# Patient Record
Sex: Female | Born: 1940 | Race: White | Hispanic: No | Marital: Single | Smoking: Never smoker
Health system: Southern US, Community
[De-identification: ages and names within clinical notes are randomized; demographics above are authoritative.]

## PROBLEM LIST (undated history)

## (undated) DIAGNOSIS — I10 Essential (primary) hypertension: Secondary | ICD-10-CM

## (undated) DIAGNOSIS — I482 Chronic atrial fibrillation, unspecified: Secondary | ICD-10-CM

## (undated) DIAGNOSIS — I447 Left bundle-branch block, unspecified: Secondary | ICD-10-CM

## (undated) DIAGNOSIS — K219 Gastro-esophageal reflux disease without esophagitis: Secondary | ICD-10-CM

## (undated) DIAGNOSIS — F329 Major depressive disorder, single episode, unspecified: Secondary | ICD-10-CM

## (undated) DIAGNOSIS — G4733 Obstructive sleep apnea (adult) (pediatric): Secondary | ICD-10-CM

## (undated) DIAGNOSIS — Z950 Presence of cardiac pacemaker: Secondary | ICD-10-CM

## (undated) DIAGNOSIS — F32A Depression, unspecified: Secondary | ICD-10-CM

## (undated) DIAGNOSIS — N183 Chronic kidney disease, stage 3 unspecified: Secondary | ICD-10-CM

## (undated) DIAGNOSIS — I5032 Chronic diastolic (congestive) heart failure: Secondary | ICD-10-CM

## (undated) DIAGNOSIS — E785 Hyperlipidemia, unspecified: Secondary | ICD-10-CM

## (undated) DIAGNOSIS — M109 Gout, unspecified: Secondary | ICD-10-CM

## (undated) DIAGNOSIS — E1129 Type 2 diabetes mellitus with other diabetic kidney complication: Secondary | ICD-10-CM

## (undated) DIAGNOSIS — J449 Chronic obstructive pulmonary disease, unspecified: Secondary | ICD-10-CM

## (undated) HISTORY — PX: PACEMAKER PLACEMENT: SHX43

---
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
## 2019-03-03 ENCOUNTER — Emergency Department (HOSPITAL_COMMUNITY): Payer: Medicare Other

## 2019-03-03 ENCOUNTER — Emergency Department (HOSPITAL_COMMUNITY)
Admission: EM | Admit: 2019-03-03 | Discharge: 2019-03-03 | Disposition: A | Discharge status: Home / Self Care | Payer: Medicare Other | Source: Home / Self Care | Attending: Emergency Medicine | Admitting: Emergency Medicine

## 2019-03-03 ENCOUNTER — Encounter (HOSPITAL_COMMUNITY): Payer: Self-pay

## 2019-03-03 DIAGNOSIS — N390 Urinary tract infection, site not specified: Secondary | ICD-10-CM | POA: Diagnosis not present

## 2019-03-03 DIAGNOSIS — W19XXXA Unspecified fall, initial encounter: Secondary | ICD-10-CM | POA: Insufficient documentation

## 2019-03-03 DIAGNOSIS — Z950 Presence of cardiac pacemaker: Secondary | ICD-10-CM | POA: Insufficient documentation

## 2019-03-03 DIAGNOSIS — E119 Type 2 diabetes mellitus without complications: Secondary | ICD-10-CM | POA: Insufficient documentation

## 2019-03-03 DIAGNOSIS — Y999 Unspecified external cause status: Secondary | ICD-10-CM | POA: Insufficient documentation

## 2019-03-03 DIAGNOSIS — M109 Gout, unspecified: Secondary | ICD-10-CM

## 2019-03-03 DIAGNOSIS — Y929 Unspecified place or not applicable: Secondary | ICD-10-CM | POA: Insufficient documentation

## 2019-03-03 DIAGNOSIS — R296 Repeated falls: Secondary | ICD-10-CM | POA: Diagnosis not present

## 2019-03-03 DIAGNOSIS — Y939 Activity, unspecified: Secondary | ICD-10-CM | POA: Insufficient documentation

## 2019-03-03 HISTORY — DX: Gout, unspecified: M10.9

## 2019-03-03 HISTORY — DX: Presence of cardiac pacemaker: Z95.0

## 2019-03-03 MED ORDER — COLCHICINE 0.6 MG PO TABS
0.6000 mg | ORAL_TABLET | Freq: Once | ORAL | Status: AC
  Administered 2019-03-03: 0.6 mg via ORAL
  Filled 2019-03-03: qty 1

## 2019-03-03 MED ORDER — COLCHICINE 0.6 MG PO TABS: 0.6000 mg | ORAL_TABLET | Freq: Every day | ORAL | 0 refills | Status: DC

## 2019-03-03 NOTE — ED Notes (Signed)
Pt discharged with all belongings. Discharge instructions reviewed with pt, and pt verbalized understanding. Opportunity for questions provided.  

## 2019-03-03 NOTE — ED Triage Notes (Signed)
Pt states that she fell two days ago, today swelling to R ankle, pt also has gout, did not head not LOC. Pt Edythe Lynn [phone] Son

## 2019-03-03 NOTE — Discharge Instructions (Addendum)
Please read and follow all provided instructions.  Your diagnoses today include:  1. Acute gout of right foot, unspecified cause    Tests performed today include:  An x-ray of the affected area - does NOT show any broken bones  Vital signs. See below for your results today.   Medications prescribed:   Colchicine - medication for gout flare  Take any prescribed medications only as directed.  Home care instructions:   Follow any educational materials contained in this packet  Follow R.I.C.E. Protocol:  R - rest your injury   I  - use ice on injury without applying directly to skin  C - compress injury with bandage or splint  E - elevate the injury as much as possible  Follow-up instructions: Please follow-up with your primary care provider. Please see attached referrals for assistance in finding a new provider here in [place].   Return instructions:   Please return if your toes or feet are numb or tingling, appear gray or blue, or you have severe pain (also elevate the leg and loosen splint or wrap if you were given one)  Please return to the Emergency Department if you experience worsening symptoms.   Please return if you have any other emergent concerns.  Additional Information:  Your vital signs today were: BP (!) 139/57    Pulse 64    Temp 99 F (37.2 C)    Resp 18    SpO2 93%  If your blood pressure (BP) was elevated above 135/85 this visit, please have this repeated by your doctor within one month. --------------

## 2019-03-03 NOTE — ED Provider Notes (Signed)
[facility] Provider Note   CSN: [id number] Arrival date & time: 03/03/19  1912     History   Chief Complaint Chief Complaint  Patient presents with  . Ankle Pain    HPI Lauren Holland is a 78 y.o. female.     Patient with history of diabetes, gout, implanted pacemaker presents to the emergency department with complaint of right foot pain.  Patient states that she fell 2 days ago and twisted her ankle.  No head or neck injury at that time.  Afterwards she developed redness and pain in her foot which is more consistent for previous flares of gout.  She is on allopurinol for this.  She recently moved from [place] 3 weeks ago and is now living in the [place] area.  She denies any knee or hip pain.  She has been ambulatory.  No fevers.  No treatments prior to arrival.     Past Medical History:  Diagnosis Date  . Diabetes mellitus without complication (HCC)   . Gout   . Pacemaker     There are no active problems to display for this patient.   History reviewed. No pertinent surgical history.   OB History   No obstetric history on file.      Home Medications    Prior to Admission medications   Medication Sig Start Date End Date Taking? Authorizing Provider  colchicine 0.6 MG tablet Take 1 tablet (0.6 mg total) by mouth daily. 03/03/19   Renne CriglerGeiple, Jevaeh Shams, PA-C    Family History No family history on file.  Social History Social History   Tobacco Use  . Smoking status: Not on file  Substance Use Topics  . Alcohol use: Not on file  . Drug use: Not on file     Allergies   Patient has no known allergies.   Review of Systems Review of Systems  Constitutional: Negative for activity change.  Musculoskeletal: Positive for arthralgias and joint swelling. Negative for back pain and neck pain.  Skin: Negative for wound.  Neurological: Negative for weakness and numbness.     Physical Exam Updated Vital Signs BP (!) 139/57    Pulse 64   Temp 99 F (37.2 C)   Resp 18   SpO2 93%   Physical Exam Vitals signs and nursing note reviewed.  Constitutional:      Appearance: She is well-developed.  HENT:     Head: Normocephalic and atraumatic.  Eyes:     Pupils: Pupils are equal, round, and reactive to light.  Neck:     Musculoskeletal: Normal range of motion and neck supple.  Cardiovascular:     Pulses: Normal pulses. No decreased pulses.  Musculoskeletal:        General: Tenderness present.     Right knee: Normal.     Right ankle: She exhibits normal range of motion, no swelling and no ecchymosis. Tenderness.     Right lower leg: Normal.     Right foot: Normal range of motion. Tenderness present. No bony tenderness.  Skin:    General: Skin is warm and dry.  Neurological:     Mental Status: She is alert.     Sensory: No sensory deficit.     Comments: Motor, sensation, and vascular distal to the injury is fully intact.       ED Treatments / Results  Labs (all labs ordered are listed, but only abnormal results are displayed) Labs Reviewed - No data to display  EKG None  Radiology Dg Ankle Complete Right  Result Date: 03/03/2019 CLINICAL DATA:  Fall EXAM: RIGHT ANKLE - COMPLETE 3+ VIEW COMPARISON:  None. FINDINGS: There is no evidence of fracture, dislocation, or joint effusion. 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 There is no evidence of arthropathy or other focal bone abnormality. Moderate lateral malleolus soft tissue swelling. IMPRESSION: Moderate lateral soft tissue swelling without acute fracture or dislocation. Ossific fragment at the medial malleolus is age indeterminate, but likely chronic. Correlation for point tenderness recommended, as a small avulsion fracture may also have this appearance. Electronically Signed   By: Ulyses Jarred M.D.   On: 03/03/2019 20:31    Procedures Procedures (including critical care time)  Medications Ordered in ED Medications   colchicine tablet 0.6 mg (0.6 mg Oral Given 03/03/19 2107)     Initial Impression / Assessment and Plan / ED Course  I have reviewed the triage vital signs and the nursing notes.  Pertinent labs & imaging results that were available during my care of the patient were reviewed by me and considered in my medical decision making (see chart for details).        Patient seen and examined.  I reviewed some of her recent records from [facility] physicians. She is on a blood thinner. She has been on colchicine in past. Reviewed patient provided list of current meds.   Vital signs reviewed and are as follows: BP (!) 139/57   Pulse 64   Temp 99 F (37.2 C)   Resp 18   SpO2 93%   8:58 PM X-ray images personally reviewed and interpreted.  No point tenderness at area of potential avulsion fracture this is likely chronic.  Symptoms today are most consistent with gouty flare.  Will start patient on colchicine.  Per records in care everywhere, she has taken colchicine in the past.  I would avoid steroids at this time given her history of diabetes.  Patient given dose of colchicine prior to discharge and prescription for home.  Encouraged PCP follow-up for treatment of her chronic medical problems.  She is given information to help obtain follow-up.  Encouraged return with worsening pain, swelling, fever.  Patient verbalizes understanding agrees with plan.   Final Clinical Impressions(s) / ED Diagnoses   Final diagnoses:  Acute gout of right foot, unspecified cause   Patient with right foot pain and.  Recent injury but x-rays are negative today.  No signs of fracture.  Foot is neurovascularly intact.  Mild warmth and erythema of the foot consistent with gouty flare.  Patient states it feels like her previous gout.  She is a diabetic but I do not see any signs of infection tonight.  I doubt a septic arthritis.  Patient started on colchicine as she is not a good candidate for prednisone or NSAIDs.  50% dose  given due to age.   ED Discharge Orders         Ordered    colchicine 0.6 MG tablet  Daily     03/03/19 2100           Carlisle Cater, Hershal Coria 03/03/19 2349    Mesner, Corene Cornea, MD 03/04/19 [phone]

## 2019-03-04 ENCOUNTER — Other Ambulatory Visit: Payer: Self-pay

## 2019-03-04 ENCOUNTER — Inpatient Hospital Stay (HOSPITAL_COMMUNITY)
Admission: EM | Admit: 2019-03-04 | Discharge: 2019-03-08 | DRG: 690 | Disposition: A | Discharge status: Skilled Nursing Facility | Payer: Medicare Other | Attending: Internal Medicine | Admitting: Internal Medicine

## 2019-03-04 ENCOUNTER — Emergency Department (HOSPITAL_COMMUNITY): Payer: Medicare Other

## 2019-03-04 ENCOUNTER — Encounter (HOSPITAL_COMMUNITY): Payer: Self-pay

## 2019-03-04 DIAGNOSIS — S301XXA Contusion of abdominal wall, initial encounter: Secondary | ICD-10-CM

## 2019-03-04 DIAGNOSIS — I4821 Permanent atrial fibrillation: Secondary | ICD-10-CM | POA: Diagnosis present

## 2019-03-04 DIAGNOSIS — Z885 Allergy status to narcotic agent status: Secondary | ICD-10-CM

## 2019-03-04 DIAGNOSIS — M159 Polyosteoarthritis, unspecified: Secondary | ICD-10-CM | POA: Diagnosis present

## 2019-03-04 DIAGNOSIS — F32A Depression, unspecified: Secondary | ICD-10-CM | POA: Diagnosis present

## 2019-03-04 DIAGNOSIS — B961 Klebsiella pneumoniae [K. pneumoniae] as the cause of diseases classified elsewhere: Secondary | ICD-10-CM | POA: Diagnosis present

## 2019-03-04 DIAGNOSIS — R531 Weakness: Secondary | ICD-10-CM

## 2019-03-04 DIAGNOSIS — Z888 Allergy status to other drugs, medicaments and biological substances status: Secondary | ICD-10-CM

## 2019-03-04 DIAGNOSIS — E860 Dehydration: Secondary | ICD-10-CM | POA: Diagnosis present

## 2019-03-04 DIAGNOSIS — I482 Chronic atrial fibrillation, unspecified: Secondary | ICD-10-CM | POA: Diagnosis present

## 2019-03-04 DIAGNOSIS — X501XXA Overexertion from prolonged static or awkward postures, initial encounter: Secondary | ICD-10-CM

## 2019-03-04 DIAGNOSIS — Z1159 Encounter for screening for other viral diseases: Secondary | ICD-10-CM

## 2019-03-04 DIAGNOSIS — E1129 Type 2 diabetes mellitus with other diabetic kidney complication: Secondary | ICD-10-CM | POA: Diagnosis present

## 2019-03-04 DIAGNOSIS — N183 Chronic kidney disease, stage 3 unspecified: Secondary | ICD-10-CM | POA: Diagnosis present

## 2019-03-04 DIAGNOSIS — Z882 Allergy status to sulfonamides status: Secondary | ICD-10-CM

## 2019-03-04 DIAGNOSIS — F329 Major depressive disorder, single episode, unspecified: Secondary | ICD-10-CM | POA: Diagnosis present

## 2019-03-04 DIAGNOSIS — Z950 Presence of cardiac pacemaker: Secondary | ICD-10-CM

## 2019-03-04 DIAGNOSIS — K219 Gastro-esophageal reflux disease without esophagitis: Secondary | ICD-10-CM | POA: Diagnosis present

## 2019-03-04 DIAGNOSIS — E1122 Type 2 diabetes mellitus with diabetic chronic kidney disease: Secondary | ICD-10-CM | POA: Diagnosis present

## 2019-03-04 DIAGNOSIS — R296 Repeated falls: Secondary | ICD-10-CM | POA: Diagnosis present

## 2019-03-04 DIAGNOSIS — G4733 Obstructive sleep apnea (adult) (pediatric): Secondary | ICD-10-CM

## 2019-03-04 DIAGNOSIS — I10 Essential (primary) hypertension: Secondary | ICD-10-CM | POA: Diagnosis present

## 2019-03-04 DIAGNOSIS — I130 Hypertensive heart and chronic kidney disease with heart failure and stage 1 through stage 4 chronic kidney disease, or unspecified chronic kidney disease: Secondary | ICD-10-CM | POA: Diagnosis present

## 2019-03-04 DIAGNOSIS — I447 Left bundle-branch block, unspecified: Secondary | ICD-10-CM | POA: Diagnosis present

## 2019-03-04 DIAGNOSIS — W19XXXA Unspecified fall, initial encounter: Secondary | ICD-10-CM | POA: Diagnosis present

## 2019-03-04 DIAGNOSIS — S99911A Unspecified injury of right ankle, initial encounter: Secondary | ICD-10-CM | POA: Diagnosis present

## 2019-03-04 DIAGNOSIS — Z7901 Long term (current) use of anticoagulants: Secondary | ICD-10-CM

## 2019-03-04 DIAGNOSIS — M109 Gout, unspecified: Secondary | ICD-10-CM | POA: Diagnosis present

## 2019-03-04 DIAGNOSIS — W19XXXD Unspecified fall, subsequent encounter: Secondary | ICD-10-CM

## 2019-03-04 DIAGNOSIS — E785 Hyperlipidemia, unspecified: Secondary | ICD-10-CM | POA: Diagnosis present

## 2019-03-04 DIAGNOSIS — J449 Chronic obstructive pulmonary disease, unspecified: Secondary | ICD-10-CM | POA: Diagnosis present

## 2019-03-04 DIAGNOSIS — I350 Nonrheumatic aortic (valve) stenosis: Secondary | ICD-10-CM | POA: Diagnosis present

## 2019-03-04 DIAGNOSIS — E1151 Type 2 diabetes mellitus with diabetic peripheral angiopathy without gangrene: Secondary | ICD-10-CM | POA: Diagnosis present

## 2019-03-04 DIAGNOSIS — Z8249 Family history of ischemic heart disease and other diseases of the circulatory system: Secondary | ICD-10-CM

## 2019-03-04 DIAGNOSIS — I5032 Chronic diastolic (congestive) heart failure: Secondary | ICD-10-CM | POA: Diagnosis present

## 2019-03-04 DIAGNOSIS — Z96653 Presence of artificial knee joint, bilateral: Secondary | ICD-10-CM | POA: Diagnosis present

## 2019-03-04 DIAGNOSIS — N390 Urinary tract infection, site not specified: Secondary | ICD-10-CM | POA: Diagnosis present

## 2019-03-04 DIAGNOSIS — Z833 Family history of diabetes mellitus: Secondary | ICD-10-CM

## 2019-03-04 DIAGNOSIS — Z66 Do not resuscitate: Secondary | ICD-10-CM | POA: Diagnosis present

## 2019-03-04 HISTORY — DX: Chronic diastolic (congestive) heart failure: I50.32

## 2019-03-04 HISTORY — DX: Chronic atrial fibrillation, unspecified: I48.20

## 2019-03-04 HISTORY — DX: Type 2 diabetes mellitus with other diabetic kidney complication: E11.29

## 2019-03-04 HISTORY — DX: Chronic obstructive pulmonary disease, unspecified: J44.9

## 2019-03-04 HISTORY — DX: Essential (primary) hypertension: I10

## 2019-03-04 HISTORY — DX: Hyperlipidemia, unspecified: E78.5

## 2019-03-04 HISTORY — DX: Depression, unspecified: F32.A

## 2019-03-04 HISTORY — DX: Obstructive sleep apnea (adult) (pediatric): G47.33

## 2019-03-04 HISTORY — DX: Chronic kidney disease, stage 3 unspecified: N18.30

## 2019-03-04 HISTORY — DX: Left bundle-branch block, unspecified: I44.7

## 2019-03-04 HISTORY — DX: Gastro-esophageal reflux disease without esophagitis: K21.9

## 2019-03-04 LAB — CBC
HCT: 37.1 % (ref 36.0–46.0)
Hemoglobin: 11.9 g/dL — ABNORMAL LOW (ref 12.0–15.0)
MCH: 32.1 pg (ref 26.0–34.0)
MCHC: 32.1 g/dL (ref 30.0–36.0)
MCV: 100 fL (ref 80.0–100.0)
Platelets: 146 10*3/uL — ABNORMAL LOW (ref 150–400)
RBC: 3.71 MIL/uL — ABNORMAL LOW (ref 3.87–5.11)
RDW: 15.3 % (ref 11.5–15.5)
WBC: 10.6 10*3/uL — ABNORMAL HIGH (ref 4.0–10.5)
nRBC: 0 % (ref 0.0–0.2)

## 2019-03-04 LAB — BASIC METABOLIC PANEL
Anion gap: 12 (ref 5–15)
BUN: 40 mg/dL — ABNORMAL HIGH (ref 8–23)
CO2: 25 mmol/L (ref 22–32)
Calcium: 9.1 mg/dL (ref 8.9–10.3)
Chloride: 103 mmol/L (ref 98–111)
Creatinine, Ser: 1.3 mg/dL — ABNORMAL HIGH (ref 0.44–1.00)
GFR calc Af Amer: 46 mL/min — ABNORMAL LOW (ref >60)
GFR calc non Af Amer: 40 mL/min — ABNORMAL LOW (ref >60)
Glucose, Bld: 192 mg/dL — ABNORMAL HIGH (ref 70–99)
Potassium: 4 mmol/L (ref 3.5–5.1)
Sodium: 140 mmol/L (ref 135–145)

## 2019-03-04 NOTE — ED Triage Notes (Signed)
Pt reports fall x 2 yesterday. Reports generalized weakness, difficulty transferring. Denies N/V, endorses SOB but states no worse than normal. Reports hx of CHF.

## 2019-03-05 ENCOUNTER — Encounter (HOSPITAL_COMMUNITY): Payer: Self-pay | Admitting: Internal Medicine

## 2019-03-05 ENCOUNTER — Emergency Department (HOSPITAL_COMMUNITY): Payer: Medicare Other

## 2019-03-05 DIAGNOSIS — S99911A Unspecified injury of right ankle, initial encounter: Secondary | ICD-10-CM | POA: Diagnosis present

## 2019-03-05 DIAGNOSIS — G4733 Obstructive sleep apnea (adult) (pediatric): Secondary | ICD-10-CM | POA: Diagnosis present

## 2019-03-05 DIAGNOSIS — R29898 Other symptoms and signs involving the musculoskeletal system: Secondary | ICD-10-CM | POA: Diagnosis not present

## 2019-03-05 DIAGNOSIS — I447 Left bundle-branch block, unspecified: Secondary | ICD-10-CM | POA: Diagnosis present

## 2019-03-05 DIAGNOSIS — W19XXXD Unspecified fall, subsequent encounter: Secondary | ICD-10-CM | POA: Diagnosis not present

## 2019-03-05 DIAGNOSIS — Z950 Presence of cardiac pacemaker: Secondary | ICD-10-CM | POA: Diagnosis not present

## 2019-03-05 DIAGNOSIS — Z885 Allergy status to narcotic agent status: Secondary | ICD-10-CM | POA: Diagnosis not present

## 2019-03-05 DIAGNOSIS — Z8249 Family history of ischemic heart disease and other diseases of the circulatory system: Secondary | ICD-10-CM | POA: Diagnosis not present

## 2019-03-05 DIAGNOSIS — I482 Chronic atrial fibrillation, unspecified: Secondary | ICD-10-CM | POA: Diagnosis present

## 2019-03-05 DIAGNOSIS — Z888 Allergy status to other drugs, medicaments and biological substances status: Secondary | ICD-10-CM | POA: Diagnosis not present

## 2019-03-05 DIAGNOSIS — I130 Hypertensive heart and chronic kidney disease with heart failure and stage 1 through stage 4 chronic kidney disease, or unspecified chronic kidney disease: Secondary | ICD-10-CM | POA: Diagnosis present

## 2019-03-05 DIAGNOSIS — E785 Hyperlipidemia, unspecified: Secondary | ICD-10-CM | POA: Insufficient documentation

## 2019-03-05 DIAGNOSIS — S301XXA Contusion of abdominal wall, initial encounter: Secondary | ICD-10-CM | POA: Diagnosis present

## 2019-03-05 DIAGNOSIS — N183 Chronic kidney disease, stage 3 unspecified: Secondary | ICD-10-CM | POA: Diagnosis present

## 2019-03-05 DIAGNOSIS — E1122 Type 2 diabetes mellitus with diabetic chronic kidney disease: Secondary | ICD-10-CM | POA: Diagnosis present

## 2019-03-05 DIAGNOSIS — J449 Chronic obstructive pulmonary disease, unspecified: Secondary | ICD-10-CM | POA: Diagnosis present

## 2019-03-05 DIAGNOSIS — Z833 Family history of diabetes mellitus: Secondary | ICD-10-CM | POA: Diagnosis not present

## 2019-03-05 DIAGNOSIS — I5032 Chronic diastolic (congestive) heart failure: Secondary | ICD-10-CM | POA: Diagnosis present

## 2019-03-05 DIAGNOSIS — R296 Repeated falls: Secondary | ICD-10-CM | POA: Diagnosis present

## 2019-03-05 DIAGNOSIS — Z9989 Dependence on other enabling machines and devices: Secondary | ICD-10-CM

## 2019-03-05 DIAGNOSIS — N390 Urinary tract infection, site not specified: Secondary | ICD-10-CM | POA: Diagnosis present

## 2019-03-05 DIAGNOSIS — R531 Weakness: Secondary | ICD-10-CM

## 2019-03-05 DIAGNOSIS — M109 Gout, unspecified: Secondary | ICD-10-CM | POA: Diagnosis present

## 2019-03-05 DIAGNOSIS — Z794 Long term (current) use of insulin: Secondary | ICD-10-CM

## 2019-03-05 DIAGNOSIS — F329 Major depressive disorder, single episode, unspecified: Secondary | ICD-10-CM | POA: Diagnosis present

## 2019-03-05 DIAGNOSIS — K219 Gastro-esophageal reflux disease without esophagitis: Secondary | ICD-10-CM | POA: Diagnosis present

## 2019-03-05 DIAGNOSIS — Z882 Allergy status to sulfonamides status: Secondary | ICD-10-CM | POA: Diagnosis not present

## 2019-03-05 DIAGNOSIS — W19XXXA Unspecified fall, initial encounter: Secondary | ICD-10-CM | POA: Diagnosis present

## 2019-03-05 DIAGNOSIS — I4821 Permanent atrial fibrillation: Secondary | ICD-10-CM | POA: Diagnosis present

## 2019-03-05 DIAGNOSIS — Z1159 Encounter for screening for other viral diseases: Secondary | ICD-10-CM | POA: Diagnosis not present

## 2019-03-05 DIAGNOSIS — E1151 Type 2 diabetes mellitus with diabetic peripheral angiopathy without gangrene: Secondary | ICD-10-CM | POA: Diagnosis present

## 2019-03-05 DIAGNOSIS — I10 Essential (primary) hypertension: Secondary | ICD-10-CM | POA: Diagnosis present

## 2019-03-05 DIAGNOSIS — E1129 Type 2 diabetes mellitus with other diabetic kidney complication: Secondary | ICD-10-CM | POA: Diagnosis present

## 2019-03-05 DIAGNOSIS — X501XXA Overexertion from prolonged static or awkward postures, initial encounter: Secondary | ICD-10-CM | POA: Diagnosis not present

## 2019-03-05 DIAGNOSIS — F32A Depression, unspecified: Secondary | ICD-10-CM | POA: Diagnosis present

## 2019-03-05 LAB — BASIC METABOLIC PANEL
Anion gap: 14 (ref 5–15)
BUN: 36 mg/dL — ABNORMAL HIGH (ref 8–23)
CO2: 24 mmol/L (ref 22–32)
Calcium: 9.3 mg/dL (ref 8.9–10.3)
Chloride: 104 mmol/L (ref 98–111)
Creatinine, Ser: 1.08 mg/dL — ABNORMAL HIGH (ref 0.44–1.00)
GFR calc Af Amer: 58 mL/min — ABNORMAL LOW (ref >60)
GFR calc non Af Amer: 50 mL/min — ABNORMAL LOW (ref >60)
Glucose, Bld: 116 mg/dL — ABNORMAL HIGH (ref 70–99)
Potassium: 3.7 mmol/L (ref 3.5–5.1)
Sodium: 142 mmol/L (ref 135–145)

## 2019-03-05 LAB — GLUCOSE, CAPILLARY
Glucose-Capillary: 150 mg/dL — ABNORMAL HIGH (ref 70–99)
Glucose-Capillary: 125 mg/dL — ABNORMAL HIGH (ref 70–99)
Glucose-Capillary: 182 mg/dL — ABNORMAL HIGH (ref 70–99)

## 2019-03-05 LAB — URINALYSIS, ROUTINE W REFLEX MICROSCOPIC
Bilirubin Urine: NEGATIVE
Glucose, UA: 50 mg/dL — AB
Ketones, ur: NEGATIVE mg/dL
Nitrite: POSITIVE — AB
Protein, ur: 100 mg/dL — AB
Specific Gravity, Urine: 1.03 (ref 1.005–1.030)
WBC, UA: >50 WBC/hpf — ABNORMAL HIGH (ref 0–5)
pH: 5 (ref 5.0–8.0)

## 2019-03-05 LAB — HEMOGLOBIN A1C
Hgb A1c MFr Bld: 7.4 % — ABNORMAL HIGH (ref 4.8–5.6)
Mean Plasma Glucose: 165.68 mg/dL

## 2019-03-05 LAB — CBG MONITORING, ED: Glucose-Capillary: 93 mg/dL (ref 70–99)

## 2019-03-05 LAB — CBC
HCT: 39.6 % (ref 36.0–46.0)
Hemoglobin: 12.7 g/dL (ref 12.0–15.0)
MCH: 32.2 pg (ref 26.0–34.0)
MCHC: 32.1 g/dL (ref 30.0–36.0)
MCV: 100.3 fL — ABNORMAL HIGH (ref 80.0–100.0)
Platelets: 139 10*3/uL — ABNORMAL LOW (ref 150–400)
RBC: 3.95 MIL/uL (ref 3.87–5.11)
RDW: 15.2 % (ref 11.5–15.5)
WBC: 8.3 10*3/uL (ref 4.0–10.5)
nRBC: 0 % (ref 0.0–0.2)

## 2019-03-05 LAB — SARS CORONAVIRUS 2 BY RT PCR (HOSPITAL ORDER, PERFORMED IN ~~LOC~~ HOSPITAL LAB): SARS Coronavirus 2: NEGATIVE

## 2019-03-05 LAB — PROTIME-INR
INR: 1.3 — ABNORMAL HIGH (ref 0.8–1.2)
INR: 1.4 — ABNORMAL HIGH (ref 0.8–1.2)
Prothrombin Time: 16 seconds — ABNORMAL HIGH (ref 11.4–15.2)
Prothrombin Time: 17.2 seconds — ABNORMAL HIGH (ref 11.4–15.2)

## 2019-03-05 LAB — BRAIN NATRIURETIC PEPTIDE: B Natriuretic Peptide: 38.6 pg/mL (ref 0.0–100.0)

## 2019-03-05 MED ORDER — MONTELUKAST SODIUM 10 MG PO TABS
10.0000 mg | ORAL_TABLET | Freq: Every day | ORAL | Status: DC
  Administered 2019-03-05 – 2019-03-07 (×3): 10 mg via ORAL
  Filled 2019-03-05 (×3): qty 1

## 2019-03-05 MED ORDER — IBUPROFEN 200 MG PO TABS: 600.0000 mg | ORAL_TABLET | Freq: Three times a day (TID) | ORAL | Status: DC

## 2019-03-05 MED ORDER — ASPIRIN 81 MG PO CHEW
81.0000 mg | CHEWABLE_TABLET | Freq: Every day | ORAL | Status: DC
  Administered 2019-03-05 – 2019-03-08 (×4): 81 mg via ORAL
  Filled 2019-03-05 (×4): qty 1

## 2019-03-05 MED ORDER — FLUTICASONE-UMECLIDIN-VILANT 100-62.5-25 MCG/INH IN AEPB: 1.0000 | INHALATION_SPRAY | Freq: Every day | RESPIRATORY_TRACT | Status: DC

## 2019-03-05 MED ORDER — WARFARIN - PHARMACIST DOSING INPATIENT: Freq: Every day | Status: DC

## 2019-03-05 MED ORDER — IOHEXOL 300 MG/ML  SOLN
80.0000 mL | Freq: Once | INTRAMUSCULAR | Status: AC | PRN
  Administered 2019-03-05: 80 mL via INTRAVENOUS

## 2019-03-05 MED ORDER — NITROGLYCERIN 0.4 MG SL SUBL: 0.4000 mg | SUBLINGUAL_TABLET | SUBLINGUAL | Status: DC | PRN

## 2019-03-05 MED ORDER — ALBUTEROL SULFATE (2.5 MG/3ML) 0.083% IN NEBU: 3.0000 mL | INHALATION_SOLUTION | RESPIRATORY_TRACT | Status: DC | PRN

## 2019-03-05 MED ORDER — METOPROLOL SUCCINATE ER 25 MG PO TB24: 50.0000 mg | ORAL_TABLET | ORAL | Status: DC

## 2019-03-05 MED ORDER — COLCHICINE 0.6 MG PO TABS
0.6000 mg | ORAL_TABLET | Freq: Every day | ORAL | Status: DC
  Administered 2019-03-05 – 2019-03-08 (×4): 0.6 mg via ORAL
  Filled 2019-03-05 (×4): qty 1

## 2019-03-05 MED ORDER — METOPROLOL SUCCINATE ER 50 MG PO TB24
50.0000 mg | ORAL_TABLET | Freq: Every day | ORAL | Status: DC
  Administered 2019-03-05 – 2019-03-07 (×3): 50 mg via ORAL
  Filled 2019-03-05 (×3): qty 1

## 2019-03-05 MED ORDER — IBUPROFEN 200 MG PO TABS
600.0000 mg | ORAL_TABLET | Freq: Three times a day (TID) | ORAL | Status: AC
  Administered 2019-03-05: 600 mg via ORAL
  Filled 2019-03-05: qty 3

## 2019-03-05 MED ORDER — WARFARIN SODIUM 5 MG PO TABS
5.0000 mg | ORAL_TABLET | Freq: Once | ORAL | Status: AC
  Administered 2019-03-05: 5 mg via ORAL
  Filled 2019-03-05: qty 1

## 2019-03-05 MED ORDER — INSULIN DETEMIR 100 UNIT/ML ~~LOC~~ SOLN
25.0000 [IU] | Freq: Every day | SUBCUTANEOUS | Status: DC
  Administered 2019-03-05 – 2019-03-07 (×3): 25 [IU] via SUBCUTANEOUS
  Filled 2019-03-05 (×4): qty 0.25

## 2019-03-05 MED ORDER — METOPROLOL SUCCINATE ER 50 MG PO TB24
100.0000 mg | ORAL_TABLET | Freq: Every day | ORAL | Status: DC
  Administered 2019-03-05 – 2019-03-08 (×4): 100 mg via ORAL
  Filled 2019-03-05 (×4): qty 2

## 2019-03-05 MED ORDER — ACETAMINOPHEN 325 MG PO TABS
650.0000 mg | ORAL_TABLET | Freq: Four times a day (QID) | ORAL | Status: DC | PRN
  Administered 2019-03-06 (×2): 650 mg via ORAL
  Filled 2019-03-05 (×2): qty 2

## 2019-03-05 MED ORDER — HYDRALAZINE HCL 20 MG/ML IJ SOLN: 5.0000 mg | INTRAMUSCULAR | Status: DC | PRN

## 2019-03-05 MED ORDER — UMECLIDINIUM BROMIDE 62.5 MCG/INH IN AEPB
1.0000 | INHALATION_SPRAY | Freq: Every day | RESPIRATORY_TRACT | Status: DC
  Administered 2019-03-06 – 2019-03-08 (×3): 1 via RESPIRATORY_TRACT
  Filled 2019-03-05: qty 7

## 2019-03-05 MED ORDER — FLUTICASONE FUROATE-VILANTEROL 100-25 MCG/INH IN AEPB
1.0000 | INHALATION_SPRAY | Freq: Every day | RESPIRATORY_TRACT | Status: DC
  Administered 2019-03-06 – 2019-03-08 (×3): 1 via RESPIRATORY_TRACT
  Filled 2019-03-05: qty 28

## 2019-03-05 MED ORDER — PANTOPRAZOLE SODIUM 20 MG PO TBEC
20.0000 mg | DELAYED_RELEASE_TABLET | Freq: Every day | ORAL | Status: DC
  Administered 2019-03-05 – 2019-03-08 (×4): 20 mg via ORAL
  Filled 2019-03-05 (×5): qty 1

## 2019-03-05 MED ORDER — ALBUTEROL SULFATE HFA 108 (90 BASE) MCG/ACT IN AERS
2.0000 | INHALATION_SPRAY | RESPIRATORY_TRACT | Status: DC | PRN
  Filled 2019-03-05: qty 6.7

## 2019-03-05 MED ORDER — AMLODIPINE BESYLATE 2.5 MG PO TABS
2.5000 mg | ORAL_TABLET | Freq: Every day | ORAL | Status: DC
  Administered 2019-03-05 – 2019-03-08 (×4): 2.5 mg via ORAL
  Filled 2019-03-05 (×4): qty 1

## 2019-03-05 MED ORDER — SODIUM CHLORIDE 0.9 % IV SOLN
1.0000 g | Freq: Every day | INTRAVENOUS | Status: DC
  Administered 2019-03-05 – 2019-03-07 (×3): 1 g via INTRAVENOUS
  Filled 2019-03-05 (×3): qty 10

## 2019-03-05 MED ORDER — ACETAMINOPHEN 650 MG RE SUPP: 650.0000 mg | Freq: Four times a day (QID) | RECTAL | Status: DC | PRN

## 2019-03-05 MED ORDER — IBUPROFEN 200 MG PO TABS
600.0000 mg | ORAL_TABLET | Freq: Three times a day (TID) | ORAL | Status: DC
  Administered 2019-03-05: 600 mg via ORAL
  Filled 2019-03-05: qty 3

## 2019-03-05 MED ORDER — INSULIN ASPART 100 UNIT/ML ~~LOC~~ SOLN
0.0000 [IU] | Freq: Three times a day (TID) | SUBCUTANEOUS | Status: DC
  Administered 2019-03-05 – 2019-03-06 (×3): 1 [IU] via SUBCUTANEOUS
  Administered 2019-03-06: 2 [IU] via SUBCUTANEOUS
  Administered 2019-03-08: 1 [IU] via SUBCUTANEOUS

## 2019-03-05 NOTE — ED Provider Notes (Addendum)
[facility] Provider Note   CSN: [id number] Arrival date & time: 03/04/19  1748    History   Chief Complaint Chief Complaint  Patient presents with  . Fall    HPI Lauren Holland is a 78 y.o. female.     HPI  This is a 78 year old female with a history of gout, hypertension, hyperlipidemia, diabetes, heart failure with pacemaker who presents with balance issues.  Patient reports over the last 6 months she has had increasing issues with balance.  She reports recurrent falls which have worsened over the last several days.  She reports 2 falls over the last 2 days.  She lives by herself and recently moved from [place].  She denies any focal weakness but states when she tries to walk she has more trouble with her left foot than her right foot.  This is not new over the last several days but has been ongoing and progressive.  She denies any speech difficulty or vision changes.  Patient denies any recent fever, cough, chest pain, nausea, vomiting, abdominal pain.  At baseline she reports shortness of breath but this is unchanged from prior.  Past Medical History:  Diagnosis Date  . Chronic diastolic (congestive) heart failure (HCC)   . Depression   . Diabetes mellitus without complication (HCC)   . GERD (gastroesophageal reflux disease)   . Gout   . HLD (hyperlipidemia)   . HTN (hypertension)   . Pacemaker     There are no active problems to display for this patient.   History reviewed. No pertinent surgical history.   OB History   No obstetric history on file.      Home Medications    Prior to Admission medications   Medication Sig Start Date End Date Taking? Authorizing Provider  colchicine 0.6 MG tablet Take 1 tablet (0.6 mg total) by mouth daily. 03/03/19   Renne CriglerGeiple, Joshua, PA-C    Family History History reviewed. No pertinent family history.  Social History Social History   Tobacco Use  . Smoking status: Not on file   Substance Use Topics  . Alcohol use: Not on file  . Drug use: Not on file     Allergies   Patient has no known allergies.   Review of Systems Review of Systems  Constitutional: Negative for fever.  Respiratory: Positive for shortness of breath. Negative for cough.   Cardiovascular: Negative for chest pain.  Gastrointestinal: Negative for abdominal pain, diarrhea, nausea and vomiting.  Genitourinary: Negative for dysuria.  Musculoskeletal: Positive for gait problem. Negative for back pain.  Skin: Positive for color change and wound.  Neurological: Positive for weakness. Negative for dizziness and headaches.  All other systems reviewed and are negative.    Physical Exam Updated Vital Signs BP (!) 135/54   Pulse (!) 59   Temp 98.2 F (36.8 C) (Oral)   Resp 20   Ht 1.524 m (5')   Wt 77.1 kg   SpO2 98%   BMI 33.20 kg/m   Physical Exam Vitals signs and nursing note reviewed.  Constitutional:      Appearance: She is well-developed. She is obese.  HENT:     Head: Normocephalic and atraumatic.     Mouth/Throat:     Mouth: Mucous membranes are moist.  Eyes:     Pupils: Pupils are equal, round, and reactive to light.  Neck:     Musculoskeletal: Neck supple.  Cardiovascular:     Rate and Rhythm: Normal rate  and regular rhythm.     Heart sounds: Normal heart sounds.  Pulmonary:     Effort: Pulmonary effort is normal. No respiratory distress.     Breath sounds: No wheezing.  Abdominal:     General: Bowel sounds are normal.     Palpations: Abdomen is soft.     Comments: Contusion and ecchymosis noted over the right flank with slight tenderness to palpation  Musculoskeletal:     Right lower leg: Edema present.     Left lower leg: Edema present.  Skin:    General: Skin is warm and dry.  Neurological:     Mental Status: She is alert and oriented to person, place, and time.     Comments: Cranial nerves II through XII intact, 5 out of 5 strength in all 4 extremities, no  dysmetria to finger-nose-finger, no drift noted, slight tremor noted of the head but no resting tremor of the extremities, on gait testing, patient is very unstable on her feet, she has difficulty initiating moving forward  Psychiatric:        Mood and Affect: Mood normal.      ED Treatments / Results  Labs (all labs ordered are listed, but only abnormal results are displayed) Labs Reviewed  BASIC METABOLIC PANEL - Abnormal; Notable for the following components:      Result Value   Glucose, Bld 192 (*)    BUN 40 (*)    Creatinine, Ser 1.30 (*)    GFR calc non Af Amer 40 (*)    GFR calc Af Amer 46 (*)    All other components within normal limits  CBC - Abnormal; Notable for the following components:   WBC 10.6 (*)    RBC 3.71 (*)    Hemoglobin 11.9 (*)    Platelets 146 (*)    All other components within normal limits  PROTIME-INR - Abnormal; Notable for the following components:   Prothrombin Time 16.0 (*)    INR 1.3 (*)    All other components within normal limits  SARS CORONAVIRUS 2 (HOSPITAL ORDER, PERFORMED IN [facility])  URINALYSIS, ROUTINE W REFLEX MICROSCOPIC    EKG EKG Interpretation  Date/Time:  Tuesday March 04 2019 23:38:33 EDT Ventricular Rate:  70 PR Interval:    QRS Duration: 195 QT Interval:  500 QTC Calculation: 540 R Axis:   50 Text Interpretation:  Sinus rhythm Short PR interval Right bundle branch block No prior Confirmed by Ross MarcusHorton, Courtney ([id number]) on 03/05/2019 2:10:14 AM   Radiology Dg Ankle Complete Right  Result Date: 03/03/2019 CLINICAL DATA:  Fall EXAM: RIGHT ANKLE - COMPLETE 3+ VIEW COMPARISON:  None. FINDINGS: There is no evidence of fracture, dislocation, or joint effusion. 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 There is no evidence of arthropathy or other focal bone abnormality. Moderate lateral malleolus soft tissue swelling. IMPRESSION: Moderate lateral soft tissue swelling without acute fracture or  dislocation. Ossific fragment at the medial malleolus is age indeterminate, but likely chronic. Correlation for point tenderness recommended, as a small avulsion fracture may also have this appearance. Electronically Signed   By: Deatra RobinsonKevin  Herman M.D.   On: 03/03/2019 20:31   Ct Head Wo Contrast  Result Date: 03/04/2019 CLINICAL DATA:  Fall EXAM: CT HEAD WITHOUT CONTRAST TECHNIQUE: Contiguous axial images were obtained from the base of the skull through the vertex without intravenous contrast. COMPARISON:  None. FINDINGS: Brain: No evidence of acute infarction, hemorrhage, hydrocephalus, extra-axial collection or mass lesion/mass  effect. Mild periventricular white matter hypodensity. Vascular: No hyperdense vessel or unexpected calcification. Skull: Normal. Negative for fracture or focal lesion. Sinuses/Orbits: No acute finding. Other: None. IMPRESSION: No acute intracranial pathology.  Small-vessel white matter disease. Electronically Signed   By: Eddie Candle M.D.   On: 03/04/2019 19:37   Ct Abdomen Pelvis W Contrast  Result Date: 03/05/2019 CLINICAL DATA:  Abdominal trauma. EXAM: CT ABDOMEN AND PELVIS WITH CONTRAST TECHNIQUE: Multidetector CT imaging of the abdomen and pelvis was performed using the standard protocol following bolus administration of intravenous contrast. CONTRAST:  45mL OMNIPAQUE IOHEXOL 300 MG/ML  SOLN COMPARISON:  None. FINDINGS: Lower chest: There is a 1.8 cm airspace opacity in the left lower lobe. Hepatobiliary: No focal liver abnormality is seen. There is underlying hepatic steatosis. No gallstones, gallbladder wall thickening, or biliary dilatation. Pancreas: Unremarkable. No pancreatic ductal dilatation or surrounding inflammatory changes. Spleen: Normal in size without focal abnormality. Adrenals/Urinary Tract: Adrenal glands are unremarkable. Kidneys are normal, without renal calculi, focal lesion, or hydronephrosis. Bladder is unremarkable. Stomach/Bowel: Stomach is within normal  limits. Appendix appears normal. No evidence of bowel wall thickening, distention, or inflammatory changes. Vascular/Lymphatic: Aortic atherosclerosis. No enlarged abdominal or pelvic lymph nodes. Reproductive: Status post hysterectomy. No adnexal masses. Other: There is some subcutaneous fat stranding along the low anterior abdominal wall which may represent sequela injections in this location. Alternatively, this could represent sequela of trauma. Musculoskeletal: Extensive lumbar fusion hardware is noted. The left L2 pedicle screw appears to be fractured. IMPRESSION: 1. No acute intra-abdominal abnormality. 2. A 1.8 cm airspace opacity in the left lower lobe is suspicious for a developing infiltrate. A three-month follow-up CT of the chest is recommended to confirm resolution of this finding. 3. Hepatic steatosis. Electronically Signed   By: Constance Holster M.D.   On: 03/05/2019 01:27    Procedures Procedures (including critical care time)  Medications Ordered in ED Medications  iohexol (OMNIPAQUE) 300 MG/ML solution 80 mL (80 mLs Intravenous Contrast Given 03/05/19 0101)     Initial Impression / Assessment and Plan / ED Course  I have reviewed the triage vital signs and the nursing notes.  Pertinent labs & imaging results that were available during my care of the patient were reviewed by me and considered in my medical decision making (see chart for details).        Patient presents with progressive balance issues and difficulty with her gait.  Has had 2 falls over the last day.  She appears to sustain injury to the right flank.  She is on blood thinners.  Lab work reviewed and largely reassuring.  CT head is negative.  Her neurologic exam is notable for primary gait disturbance and difficulty with initiating forward motion.  She does have a slight head tremor.  She is unsafe and lives by herself.  She needs physical therapy.  She likely needs an MRI as well to rule out subacute stroke  given that her symptoms seem to be worse on the left than the right close subtle.  CT scan of the abdomen obtained given contusion to the right flank and blood thinners.  CT reviewed and is negative for acute injury.  She does have a small possible developing pneumonia; however, she is otherwise asymptomatic.  This was discussed with Dr. Donna Bernard.  Final Clinical Impressions(s) / ED Diagnoses   Final diagnoses:  Recurrent falls  Contusion of flank, initial encounter    ED Discharge Orders    None  Shon BatonHorton, Courtney F, MD 03/05/19 [id number]    Shon BatonHorton, Courtney F, MD 03/05/19 [phone]

## 2019-03-05 NOTE — Consult Note (Addendum)
Neurology Consultation  Reason for Consult: Leg weakness and falls Referring Physician: Jomarie LongsJoseph  CC: Bilateral leg weakness/falls  History is obtained from: Chart and patient  HPI: Lauren Holland is a 78 y.o. female diabetes, pacemaker, left bundle branch block, hypertension, hyperlipidemia, COPD, chronic kidney disease, atrial fibrillation, chronic Coumadin, osteoarthritis.  Patient states that for multiple multiple years she has been having bilateral leg weakness secondary to her osteoarthritis.  Is unfortunate, however she is not able to have surgery as they do not think that she is strong enough for bilateral knee replacements.  She states that she has difficulties every day with her chronic knee discomfort and also right hip discomfort which she has significant osteoarthritis.  She does have history of fall, her first fall was approximately "Thanksgiving's ago "while she was trying to get into a car and her leg gave out from underneath her.  She feels as though although she has been very weak since then she has not had further falls until recently.  She does state that she is not an active person and gets out of breath very easily.  Majority of the time is spent in her house or sitting secondary to both pain and shortness of breath.  On Saturday she had gone to the bathroom, stood up and was walking to get into her chair when suddenly both legs gave out from underneath her and she fell.  EMS did come to the house but she refused to go to the hospital.  Monday she was using her wheelchair, went to stand up using her guard rail and again both legs went out from underneath her and she fell.  She denies any syncopal event or loss of consciousness.  At this point she did go to the hospital for further care.  She herself cannot state which leg feels weaker and continues to perseverate on the fact that both of her legs have severe OA.  Currently she feels weak all over.  Of note [place] and  has not had her INR checked.  When was checked it was below what should be within normal limits.   ED course: Vitals, labs, x-rays, CT head   LKW: Unknown tpa given?: no, out of window Premorbid modified Rankin scale (mRS): 2 NIH stroke scale of 0   ROS: A 14 point ROS was performed and is negative except as noted in the HPI.   Past Medical History:  Diagnosis Date  . Atrial fibrillation, chronic   . Chronic diastolic (congestive) heart failure (HCC)   . CKD (chronic kidney disease), stage III (HCC)   . COPD (chronic obstructive pulmonary disease) (HCC)   . Depression   . GERD (gastroesophageal reflux disease)   . Gout   . HLD (hyperlipidemia)   . HTN (hypertension)   . LBBB (left bundle branch block)   . OSA on CPAP   . Pacemaker   . Type II diabetes mellitus with renal manifestations (HCC)     Family History  Problem Relation Age of Onset  . Diabetes Mellitus II Mother   . Hypertension Mother   . Hypertension Father   . Diabetes Mellitus II Sister   . Hypertension Sister    Social History:   reports that she has never smoked. She has never used smokeless tobacco. She reports previous alcohol use. She reports that she does not use drugs.  Medications  Current Facility-Administered Medications:  .  acetaminophen (TYLENOL) tablet 650 mg, 650 mg, Oral, Q6H PRN **  OR** acetaminophen (TYLENOL) suppository 650 mg, 650 mg, Rectal, Q6H PRN, Ivor Costa, MD .  albuterol (PROVENTIL) (2.5 MG/3ML) 0.083% nebulizer solution 3 mL, 3 mL, Inhalation, Q4H PRN, Nyoka Cowden, Terri L, RPH .  amLODipine (NORVASC) tablet 2.5 mg, 2.5 mg, Oral, Daily, Ivor Costa, MD, 2.5 mg at 03/05/19 1112 .  aspirin chewable tablet 81 mg, 81 mg, Oral, Daily, Ivor Costa, MD, 81 mg at 03/05/19 1111 .  cefTRIAXone (ROCEPHIN) 1 g in sodium chloride 0.9 % 100 mL IVPB, 1 g, Intravenous, Daily, Ivor Costa, MD, Stopped at 03/05/19 [phone] .  colchicine tablet 0.6 mg, 0.6 mg, Oral, Daily, Ivor Costa, MD, 0.6 mg at 03/05/19  1112 .  fluticasone furoate-vilanterol (BREO ELLIPTA) 100-25 MCG/INH 1 puff, 1 puff, Inhalation, Daily **AND** umeclidinium bromide (INCRUSE ELLIPTA) 62.5 MCG/INH 1 puff, 1 puff, Inhalation, Daily, Ivor Costa, MD .  hydrALAZINE (APRESOLINE) injection 5 mg, 5 mg, Intravenous, Q2H PRN, Ivor Costa, MD .  ibuprofen (ADVIL) tablet 600 mg, 600 mg, Oral, TID, Domenic Polite, MD .  insulin aspart (novoLOG) injection 0-9 Units, 0-9 Units, Subcutaneous, TID WC, Ivor Costa, MD, 1 Units at 03/05/19 1343 .  insulin detemir (LEVEMIR) injection 25 Units, 25 Units, Subcutaneous, QHS, Ivor Costa, MD .  metoprolol succinate (TOPROL-XL) 24 hr tablet 100 mg, 100 mg, Oral, Daily, 100 mg at 03/05/19 1111 **AND** metoprolol succinate (TOPROL-XL) 24 hr tablet 50 mg, 50 mg, Oral, QHS, Green, Terri L, RPH .  montelukast (SINGULAIR) tablet 10 mg, 10 mg, Oral, QHS, Ivor Costa, MD .  nitroGLYCERIN (NITROSTAT) SL tablet 0.4 mg, 0.4 mg, Sublingual, Q5H PRN, Ivor Costa, MD .  pantoprazole (PROTONIX) EC tablet 20 mg, 20 mg, Oral, Daily, Ivor Costa, MD, 20 mg at 03/05/19 1342 .  warfarin (COUMADIN) tablet 5 mg, 5 mg, Oral, ONCE-1800, Green, Terri L, RPH .  Warfarin - Pharmacist Dosing Inpatient, , Does not apply, q1800, Minda Ditto, RPH   Exam: Current vital signs: BP (!) 147/72 (BP Location: Right Arm)   Pulse 77   Temp 98.2 F (36.8 C) (Oral)   Resp 18   Ht 5' (1.524 m)   Wt 77.1 kg   SpO2 98%   BMI 33.20 kg/m  Vital signs in last 24 hours: Temp:  [98.2 F (36.8 C)] 98.2 F (36.8 C) (06/24 0932) Pulse Rate:  [59-77] 77 (06/24 1233) Resp:  [12-27] 18 (06/24 0932) BP: (99-155)/(38-95) 147/72 (06/24 1233) SpO2:  [96 %-98 %] 98 % (06/24 0932) Weight:  [77.1 kg] 77.1 kg (06/23 1803)  Physical Exam  Constitutional: Appears well-developed and well-nourished.  Psych: Affect appropriate to situation Eyes: No scleral injection HENT: No OP obstrucion Head: Normocephalic.  With titubation Cardiovascular: Irregular  irregular Respiratory: Mild effort with nasal cannula in place GI: Soft.  No distension. There is no tenderness.  Skin: Cabbell bruising  Neuro: Mental Status: Patient is awake, alert, oriented to person, place, month, year, and situation.  She does take a long time to get her thoughts out Patient is able to give a clear and coherent history. No signs of aphasia or neglect Cranial Nerves: II: Visual Fields are full.  III,IV, VI: EOMI without ptosis or diploplia. Pupils equal, round and reactive to light V: Facial sensation is symmetric to temperature VII: Possible left facial droop when smiling VIII: hearing is intact to voice X: Palat elevates symmetrically XI: Shoulder shrug is symmetric. XII: tongue is midline without atrophy or fasciculations.  Motor: Bilateral shoulder shrug 5/5, left bicep and tricep extension 5/5, right  bicep flexion is 5/5 right tricep extension is 4/5.  Bilateral hand has significant osteoarthritis and weak grip.  The left leg is 5/5 strength, right leg has 4+/5 strength at hip flexion,knee flexion which is 4/5 strength.  Right dorsi extension and flexion is 4+/5.  Of note she has significant crepitation of both knees.  Patient also has a mild tremor when hands are held out in extension Sensory: Sensation is symmetric to light touch and temperature in the arms and legs. Deep Tendon Reflexes: 1+ in bilateral upper extremities no reflexes in the knees or the ankles Plantars: Toes are downgoing bilaterally.  Cerebellar: FNF within normal limits  Labs I have reviewed labs in epic and the results pertinent to this consultation are:   CBC    Component Value Date/Time   WBC 8.3 03/05/2019 0500   RBC 3.95 03/05/2019 0500   HGB 12.7 03/05/2019 0500   HCT 39.6 03/05/2019 0500   PLT 139 (L) 03/05/2019 0500   MCV 100.3 (H) 03/05/2019 0500   MCH 32.2 03/05/2019 0500   MCHC 32.1 03/05/2019 0500   RDW 15.2 03/05/2019 0500    CMP     Component Value  Date/Time   NA 142 03/05/2019 0500   K 3.7 03/05/2019 0500   CL 104 03/05/2019 0500   CO2 24 03/05/2019 0500   GLUCOSE 116 (H) 03/05/2019 0500   BUN 36 (H) 03/05/2019 0500   CREATININE 1.08 (H) 03/05/2019 0500   CALCIUM 9.3 03/05/2019 0500   GFRNONAA 50 (L) 03/05/2019 0500   GFRAA 58 (L) 03/05/2019 0500    Lipid Panel  No results found for: CHOL, TRIG, HDL, CHOLHDL, VLDL, LDLCALC, LDLDIRECT   Imaging I have reviewed the images obtained:  CT-scan of the brain FINDINGS: Brain: No evidence of acute infarction, hemorrhage, hydrocephalus, extra-axial collection or mass lesion/mass effect. Mild periventricular white matter hypodensity.  MRI examination of the brain-unable to obtain MRI secondary to pacemaker  Felicie Mornavid Smith PA-C [facility] [phone]  M-F  (9:00 am- 5:00 PM)  03/05/2019, 3:41 PM    NEUROHOSPITALIST ADDENDUM Performed a face to face diagnostic evaluation.   I have reviewed the contents of history and physical exam as documented by PA/ARNP/Resident and agree with above documentation.  I have discussed and formulated the above plan as documented. Edits to the note have been made as needed.     Assessment:  Right leg weakness likely chronic and from long standing history of arthritis, deconditioning. No sensory abnormalities in the right leg and mild weakness compared to the left, which patient states is apparently chronic due to arthritis in her Right hip.   CT head does not show any acute to subacute infarcts, which would show up since her symptoms have been going for atleast several days. Unable to obtain MRI brain due to pacemaker. While it is possible that she has small infarct missed on MRI, this would have likely have occurred in the setting of subtherapeutic Coumadin and further stroke workup such as echo, doppler unlikely to change stroke management.   Impression: -Osteoarthritis -Bilateral leg weakness, with Right keg weaker than left -  Deconditioning  Recommendations: - HbA1c, LDL  -PT OT evaluation -Continue Coumadin with INR goal of 2-3.    Georgiana SpinnerSushanth  MD [facility] [phone]   If 7pm to 7am, please call on call as listed on AMION.

## 2019-03-05 NOTE — ED Notes (Signed)
Tele   Breakfast ordered  

## 2019-03-05 NOTE — Progress Notes (Addendum)
Patient admitted past midnight. See H&P for details. In brief very pleasant 78 yo hx htn, dm, chf, afib s/p ablation and pacer, LBBB, gout, copd prn oxygen at home admitted for fall. Work up revealed gout flare right foot, possible UTI, acute on chronic kidney disease III. Just moved here from [place] 3 weeks ago.   PE Gen: awake alert no acute distress CV. rrr +murmur,  Trace LE edema. Right foot with erythema, heat tenderness Resp: no increased work of breathing. BS clear.  Skin: hematoma right flank.    A/P  Fall and generalized weakness.  Etiology unclear. possible multifactorial. worsening renal function/uti/gout right foot. Not eating/drinking properly due to recent relocation/adjusting.  CT head is negative for acute intracranial abnormalities.  Likely due to UTI.  Patient does not have focal neurologic findings on physical examination.no event on tele -gentle IV fluids -rocephin -follow urine culture -follow blood culture -pt/OT -check ortho static status  Possible UTI: Urinalysis showed turbid appearance, WBC> 50, few bacteria, large amount of leukocyte, positive nitrates -IV Rocephin -follow Blood culture -follow urine culture  Chronic diastolic (congestive) heart failure ([place]): 2D echo on 01/05/2015 showed EF of 55-60%.  Patient does not have worsening shortness of breath. No JVD.  CHF seem to be compensated. -Holding Lasix due to worsening renal function -check BNP -intake and output -daily weight  Atrial fibrillation, chronic:  CHA2DS2-VASc Score is 6, needs oral anticoagulation. Patient is on Coumadin at home. INR is  on admission. Heart rate is well controlled. Hg stable -holding for coumadin due to right flank hematoma -continue metoprolol -monitor cbc  CKD (chronic kidney disease), stage III ([place]): worsening than base line. Baseline Cre is 0.93 on 05/28/18, pt's Cre is 1.30 and BUN 40 on admission. Likely due to prerenal secondary to dehydration and continuation  of ARB, diuretics. Creatinine trending down this am - Holding lasix and Cozarr (pt states that she is not taking Cozarr)  COPD (chronic obstructive pulmonary disease) ([place]): stable. CT-abdomen/pelvis showed possible LLL infiltration, but pt does not have fever or worsening shortness of breath or cough.  She has mild leukocytosis with WBC 10.6. Clinically does not seem to have pneumonia.   -Continue bronchodilators and Singulair  Depression: -hold BuSpar due to QT prolongation 540  GERD (gastroesophageal reflux disease): -Protonix  Gout: - add ibuprofen for 2 days -Continue colchicine  HTN: fair control -Continue home medications: Amlodipine -IV hydralazine prn -monitor  OSA - on CPAP  Type II diabetes mellitus with renal manifestations [facility]): Patient is taking metformin and Levemir at home. Blood sugar 192. -will decrease Levemir dose from 50 to 25 units daily -SSI  Mialee Weyman, NP

## 2019-03-05 NOTE — Progress Notes (Addendum)
[place] for Warfarin Indication: atrial fibrillation  Allergies  Allergen Reactions  . Codeine Palpitations  . Sulfamethoxazole-Trimethoprim Anaphylaxis  . Metoclopramide Other (See Comments)    Tremors   . Fenofibrate Other (See Comments)    Patient refuses to take it   Patient Measurements: Height: 5' (152.4 cm) Weight: 170 lb (77.1 kg) IBW/kg (Calculated) : 45.5  Vital Signs: Temp: 98.2 F (36.8 C) (06/24 0932) Temp Source: Oral (06/24 0932) BP: 147/60 (06/24 1226) Pulse Rate: 61 (06/24 1226)  Labs: Recent Labs    03/04/19 2000 03/05/19 0500 03/05/19 0521  HGB 11.9* 12.7  --   HCT 37.1 39.6  --   PLT 146* 139*  --   LABPROT 16.0*  --  17.2*  INR 1.3*  --  1.4*  CREATININE 1.30* 1.08*  --     Estimated Creatinine Clearance: 40.6 mL/min (A) (by C-G formula based on SCr of 1.08 mg/dL (H)).   Medical History: Past Medical History:  Diagnosis Date  . Atrial fibrillation, chronic   . Chronic diastolic (congestive) heart failure ([place])   . CKD (chronic kidney disease), stage III ([place])   . COPD (chronic obstructive pulmonary disease) ([place])   . Depression   . GERD (gastroesophageal reflux disease)   . Gout   . HLD (hyperlipidemia)   . HTN (hypertension)   . LBBB (left bundle branch block)   . OSA on CPAP   . Pacemaker   . Type II diabetes mellitus with renal manifestations (HCC)    Medications:  Scheduled:  . amLODipine  2.5 mg Oral Daily  . aspirin  81 mg Oral Daily  . colchicine  0.6 mg Oral Daily  . fluticasone furoate-vilanterol  1 puff Inhalation Daily   And  . umeclidinium bromide  1 puff Inhalation Daily  . ibuprofen  600 mg Oral TID  . insulin aspart  0-9 Units Subcutaneous TID WC  . insulin detemir  25 Units Subcutaneous QHS  . metoprolol succinate  100 mg Oral Daily   And  . metoprolol succinate  50 mg Oral QHS  . montelukast  10 mg Oral QHS  . pantoprazole  20 mg Oral Daily    Assessment: 21 yoF  fell at home, small ecchymoses on bac  PMH: Afib on Warf 5mg  qd, LD 6/23 at 8p, dCHF, AV node ablation, pacemaker, HTN, hx TIA, DM2, COPD/O2  Goal of Therapy:  INR 2-3 Monitor platelets by anticoagulation protocol: Yes   Plan:  Warfarin 5mg  today at 1800 Daily Protime/INR Monitor CBC, s/s bleed Note using Ibuprofen 600mg  tid scheduled, Cl ~ 40 ml/min and ASA 81mg  daily is continued as PTA  Franke Menter L 03/05/2019,12:27 PM

## 2019-03-05 NOTE — Plan of Care (Signed)

## 2019-03-05 NOTE — ED Notes (Signed)
Family at bedside. 

## 2019-03-05 NOTE — Evaluation (Signed)
Physical Therapy Evaluation Patient Details Name: Lauren Holland MRN: [id number] DOB: [birth year] Today's Date: 03/05/2019   History of Present Illness  Pt is a 78 y/o female admitted secondary to weakness and multiple falls. Pending workup. CT of head negative for acute abnormality. PMH includes DM, dCHF, OSA on CPAP, a fib, CKD, COPD, gout, and HTN.   Clinical Impression  Pt admitted secondary to problem above with deficits below. Pt presenting with BLE weakness and decreased balance. REquired max A to stand with use of RW and only able to tolerate brief period in standing. Pt with multiple falls prior to admission and pt reports being very fearful of falling again. Feel she will require SNF level therapies prior to d/c home. Will continue to follow acutely to maximize functional mobility independence and safety.     Follow Up Recommendations SNF    Equipment Recommendations  None recommended by PT    Recommendations for Other Services       Precautions / Restrictions Precautions Precautions: Fall Precaution Comments: Pt with multiple falls within the past couple of days.  Restrictions Weight Bearing Restrictions: No      Mobility  Bed Mobility Overal bed mobility: Needs Assistance Bed Mobility: Supine to Sit;Sit to Supine     Supine to sit: Min assist Sit to supine: Min assist   General bed mobility comments: Min A for trunk elevation to come to EOB. Min A for LE assist for return to supine.   Transfers Overall transfer level: Needs assistance Equipment used: Rolling walker (2 wheeled) Transfers: Sit to/from Stand Sit to Stand: Max assist         General transfer comment: Pt requiring max A to stand with RW. Cues for safe hand placement. Pt only able to tolerate a few seconds in standing before having to sit.   Ambulation/Gait             General Gait Details: unable   Stairs            Wheelchair Mobility    Modified Rankin (Stroke Patients Only)        Balance Overall balance assessment: Needs assistance Sitting-balance support: No upper extremity supported;Feet supported Sitting balance-Leahy Scale: Fair     Standing balance support: Bilateral upper extremity supported;During functional activity Standing balance-Leahy Scale: Zero Standing balance comment: Reliant on max A and BUE support                              Pertinent Vitals/Pain Pain Assessment: Faces Faces Pain Scale: Hurts little more Pain Location: R ankle  Pain Descriptors / Indicators: Aching;Grimacing;Guarding Pain Intervention(s): Limited activity within patient's tolerance;Monitored during session;Repositioned    Home Living Family/patient expects to be discharged to:: Private residence Living Arrangements: Alone Available Help at Discharge: Family;Available PRN/intermittently Type of Home: House Home Access: Level entry     Home Layout: One level Home Equipment: Walker - 2 wheels;Wheelchair - manual      Prior Function Level of Independence: Independent with assistive device(s)         Comments: Had been using WC secondary to R ankle pain. Has had multiple falls over last few days, however.      Hand Dominance        Extremity/Trunk Assessment   Upper Extremity Assessment Upper Extremity Assessment: Defer to OT evaluation    Lower Extremity Assessment Lower Extremity Assessment: Generalized weakness;RLE deficits/detail RLE Deficits / Details: R ankle pain upon  admission.     Cervical / Trunk Assessment Cervical / Trunk Assessment: Kyphotic  Communication   Communication: No difficulties  Cognition Arousal/Alertness: Awake/alert Behavior During Therapy: WFL for tasks assessed/performed Overall Cognitive Status: No family/caregiver present to determine baseline cognitive functioning                                 General Comments: Pt reports she is having difficulty remembering things.        General Comments General comments (skin integrity, edema, etc.): Pt reports she is concerned about returning home alone. Discussed SNF and pt agreeable.     Exercises     Assessment/Plan    PT Assessment Patient needs continued PT services  PT Problem List Decreased strength;Decreased balance;Decreased activity tolerance;Decreased mobility;Decreased knowledge of use of DME;Decreased knowledge of precautions       PT Treatment Interventions DME instruction;Gait training;Functional mobility training;Therapeutic activities;Therapeutic exercise;Balance training;Patient/family education    PT Goals (Current goals can be found in the Care Plan section)  Acute Rehab PT Goals Patient Stated Goal: to get stronger and to stop falling PT Goal Formulation: With patient Time For Goal Achievement: 03/19/19 Potential to Achieve Goals: Good    Frequency Min 2X/week   Barriers to discharge Decreased caregiver support      Co-evaluation               AM-PAC PT "6 Clicks" Mobility  Outcome Measure Help needed turning from your back to your side while in a flat bed without using bedrails?: A Little Help needed moving from lying on your back to sitting on the side of a flat bed without using bedrails?: A Lot Help needed moving to and from a bed to a chair (including a wheelchair)?: Total Help needed standing up from a chair using your arms (e.g., wheelchair or bedside chair)?: Total Help needed to walk in hospital room?: Total Help needed climbing 3-5 steps with a railing? : Total 6 Click Score: 9    End of Session Equipment Utilized During Treatment: Gait belt Activity Tolerance: Patient tolerated treatment well Patient left: in bed;with call bell/phone within reach;with bed alarm set Nurse Communication: Mobility status PT Visit Diagnosis: Unsteadiness on feet (R26.81);Muscle weakness (generalized) (M62.81);Difficulty in walking, not elsewhere classified (R26.2);History of falling  (Z91.81);Repeated falls (R29.6)    Time: [id number] PT Time Calculation (min) (ACUTE ONLY): 20 min   Charges:   PT Evaluation $PT Eval Moderate Complexity: 1 Mod          Gladys DammeBrittany Jeoffrey Eleazer, PT, DPT  Acute Rehabilitation Services  Pager: [phone] Office: [phone]   Lehman PromBrittany S Dierre Crevier 03/05/2019, 6:18 PM

## 2019-03-05 NOTE — H&P (Signed)
History and Physical    Lauren RushingCora Holland ZOX:[id number] DOB: 11/06/1940 DOA: 03/04/2019  Referring MD/NP/PA:   PCP: System, Pcp Not In   Patient coming from:  The patient is coming from home.  At baseline, pt is independent for most of ADL.        Chief Complaint: fall  HPI: Lauren RushingCora Holland is a 78 y.o. female with medical history significant of hypertension, hyperlipidemia, diabetes mellitus, COPD, GERD, gout, depression, pacemaker placement, atrial fibrillation on Coumadin, dCHF, LBBB, OSA on CPAP, CKD-3, PVD, aortic stenosis, who presents with fall.  [place]  She states that in the past several days, she feels generalized weak, no unilateral numbness or tingling his extremities.  No facial droop or slurred speech.  She fell twice yesterday, and has difficulty walking because of generalized weakness.  She has injury to right ankle and right flank area causing some pain.  Patient denies chest pain.  She states that she has mild intermittent shortness of breath and dry cough due to COPD, which has not changed.  No fever or chills.  Denies nausea, vomiting, diarrhea, abdominal pain.  Denies symptoms of UTI.  ED Course: pt was found to have WBC 10.6, INR 1.3, pending COVID-19 test, worsening renal function, pending UA, temperature normal, no tachycardia, oxygen saturation 96% on room air, blood pressure 135/54, CT head is negative for acute intracranial abnormalities.  X-ray of her right ankle is negative for fracture.  CT abdomen/pelvis is negative for acute intra-abdominal issues, bladder showed 1.8 cm for infiltration in left lower lobe.  Patient is placed on telemetry bed for observation.  Review of Systems:   General: no fevers, chills, no body weight gain, has fatigue HEENT: no blurry vision, hearing changes or sore throat Respiratory: has dyspnea, coughing, no wheezing CV: no chest pain, no palpitations GI: no nausea, vomiting, abdominal pain, diarrhea,  constipation GU: no dysuria, burning on urination, increased urinary frequency, hematuria  Ext: has mild leg edema Neuro: no unilateral weakness, numbness, or tingling, no vision change or hearing loss. Has fall Skin: no rash, no skin tear. MSK: No muscle spasm, no deformity, no limitation of range of movement in spin Heme: No easy bruising.  Travel history: No recent long distant travel.  Allergy:  Allergies  Allergen Reactions  . Codeine Palpitations  . Sulfamethoxazole-Trimethoprim Anaphylaxis  . Metoclopramide Other (See Comments)    Tremors   . Fenofibrate Other (See Comments)    Patient refuses to take it    Past Medical History:  Diagnosis Date  . Atrial fibrillation, chronic   . Chronic diastolic (congestive) heart failure (HCC)   . CKD (chronic kidney disease), stage III (HCC)   . COPD (chronic obstructive pulmonary disease) (HCC)   . Depression   . GERD (gastroesophageal reflux disease)   . Gout   . HLD (hyperlipidemia)   . HTN (hypertension)   . LBBB (left bundle branch block)   . OSA on CPAP   . Pacemaker   . Type II diabetes mellitus with renal manifestations [facility](HCC)     Past Surgical History:  Procedure Laterality Date  . PACEMAKER PLACEMENT      Social History:  reports that she has never smoked. She has never used smokeless tobacco. She reports previous alcohol use. She reports that she does not use drugs.  Family History:  Family History  Problem Relation Age of Onset  . Diabetes Mellitus II Mother   . Hypertension Mother   . Hypertension Father   .  Diabetes Mellitus II Sister   . Hypertension Sister      Prior to Admission medications   Medication Sig Start Date End Date Taking? Authorizing Provider  colchicine 0.6 MG tablet Take 1 tablet (0.6 mg total) by mouth daily. 03/03/19   Renne CriglerGeiple, Joshua, PA-C    Physical Exam: Vitals:   03/04/19 1755 03/04/19 1803 03/05/19 0015 03/05/19 0045  BP: (!) 99/38  (!) 143/55 (!) 135/54  Pulse: 63  68 (!)  59  Resp: 12  17 20   Temp: 98.2 F (36.8 C)     TempSrc: Oral     SpO2: 96%  97% 98%  Weight:  77.1 kg    Height:  5' (1.524 m)     General: Not in acute distress HEENT:       Eyes: PERRL, EOMI, no scleral icterus.       ENT: No discharge from the ears and nose, no pharynx injection, no tonsillar enlargement.        Neck: No JVD, no bruit, no mass felt. Heme: No neck lymph node enlargement. Cardiac: S1/S2, RRR, No murmurs, No gallops or rubs. Respiratory: No rales, wheezing, rhonchi or rubs. GI: Soft, nondistended, nontender, no rebound pain, no organomegaly, BS present. GU: No hematuria Ext: has trace leg edema bilaterally. 2+DP/PT pulse bilaterally. Musculoskeletal: No joint deformities, No joint redness or warmth, no limitation of ROM in spin. Skin: No rashes. Has right flank hematoma and contusion Neuro: Alert, oriented X3, cranial nerves II-XII grossly intact, moves all extremities normally. Muscle strength 5/5 in all extremities, sensation to light touch intact. Brachial reflex 2+ bilaterally.  Psych: Patient is not psychotic, no suicidal or hemocidal ideation.  Labs on Admission: I have personally reviewed following labs and imaging studies  CBC: Recent Labs  Lab 03/04/19 2000  WBC 10.6*  HGB 11.9*  HCT 37.1  MCV 100.0  PLT 146*   Basic Metabolic Panel: Recent Labs  Lab 03/04/19 2000  NA 140  K 4.0  CL 103  CO2 25  GLUCOSE 192*  BUN 40*  CREATININE 1.30*  CALCIUM 9.1   GFR: Estimated Creatinine Clearance: 33.8 mL/min (A) (by C-G formula based on SCr of 1.3 mg/dL (H)). Liver Function Tests: No results for input(s): AST, ALT, ALKPHOS, BILITOT, PROT, ALBUMIN in the last 168 hours. No results for input(s): LIPASE, AMYLASE in the last 168 hours. No results for input(s): AMMONIA in the last 168 hours. Coagulation Profile: Recent Labs  Lab 03/04/19 2000  INR 1.3*   Cardiac Enzymes: No results for input(s): CKTOTAL, CKMB, CKMBINDEX, TROPONINI in the last  168 hours. BNP (last 3 results) No results for input(s): PROBNP in the last 8760 hours. HbA1C: No results for input(s): HGBA1C in the last 72 hours. CBG: No results for input(s): GLUCAP in the last 168 hours. Lipid Profile: No results for input(s): CHOL, HDL, LDLCALC, TRIG, CHOLHDL, LDLDIRECT in the last 72 hours. Thyroid Function Tests: No results for input(s): TSH, T4TOTAL, FREET4, T3FREE, THYROIDAB in the last 72 hours. Anemia Panel: No results for input(s): VITAMINB12, FOLATE, FERRITIN, TIBC, IRON, RETICCTPCT in the last 72 hours. Urine analysis:    Component Value Date/Time   COLORURINE YELLOW 03/05/2019 0306   APPEARANCEUR TURBID (A) 03/05/2019 0306   LABSPEC 1.030 03/05/2019 0306   PHURINE 5.0 03/05/2019 0306   GLUCOSEU 50 (A) 03/05/2019 0306   HGBUR SMALL (A) 03/05/2019 0306   BILIRUBINUR NEGATIVE 03/05/2019 0306   KETONESUR NEGATIVE 03/05/2019 0306   PROTEINUR 100 (A) 03/05/2019 0306  NITRITE POSITIVE (A) 03/05/2019 0306   LEUKOCYTESUR LARGE (A) 03/05/2019 0306   Sepsis Labs: @LABRCNTIP (procalcitonin:4,lacticidven:4) )No results found for this or any previous visit (from the past 240 hour(s)).   Radiological Exams on Admission: Dg Ankle Complete Right  Result Date: 03/03/2019 CLINICAL DATA:  Fall EXAM: RIGHT ANKLE - COMPLETE 3+ VIEW COMPARISON:  None. FINDINGS: There is no evidence of fracture, dislocation, or joint effusion. 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 There is no evidence of arthropathy or other focal bone abnormality. Moderate lateral malleolus soft tissue swelling. IMPRESSION: Moderate lateral soft tissue swelling without acute fracture or dislocation. Ossific fragment at the medial malleolus is age indeterminate, but likely chronic. Correlation for point tenderness recommended, as a small avulsion fracture may also have this appearance. Electronically Signed   By: Ulyses Jarred M.D.   On: 03/03/2019 20:31   Ct Head Wo Contrast   Result Date: 03/04/2019 CLINICAL DATA:  Fall EXAM: CT HEAD WITHOUT CONTRAST TECHNIQUE: Contiguous axial images were obtained from the base of the skull through the vertex without intravenous contrast. COMPARISON:  None. FINDINGS: Brain: No evidence of acute infarction, hemorrhage, hydrocephalus, extra-axial collection or mass lesion/mass effect. Mild periventricular white matter hypodensity. Vascular: No hyperdense vessel or unexpected calcification. Skull: Normal. Negative for fracture or focal lesion. Sinuses/Orbits: No acute finding. Other: None. IMPRESSION: No acute intracranial pathology.  Small-vessel white matter disease. Electronically Signed   By: Eddie Candle M.D.   On: 03/04/2019 19:37   Ct Abdomen Pelvis W Contrast  Result Date: 03/05/2019 CLINICAL DATA:  Abdominal trauma. EXAM: CT ABDOMEN AND PELVIS WITH CONTRAST TECHNIQUE: Multidetector CT imaging of the abdomen and pelvis was performed using the standard protocol following bolus administration of intravenous contrast. CONTRAST:  15mL OMNIPAQUE IOHEXOL 300 MG/ML  SOLN COMPARISON:  None. FINDINGS: Lower chest: There is a 1.8 cm airspace opacity in the left lower lobe. Hepatobiliary: No focal liver abnormality is seen. There is underlying hepatic steatosis. No gallstones, gallbladder wall thickening, or biliary dilatation. Pancreas: Unremarkable. No pancreatic ductal dilatation or surrounding inflammatory changes. Spleen: Normal in size without focal abnormality. Adrenals/Urinary Tract: Adrenal glands are unremarkable. Kidneys are normal, without renal calculi, focal lesion, or hydronephrosis. Bladder is unremarkable. Stomach/Bowel: Stomach is within normal limits. Appendix appears normal. No evidence of bowel wall thickening, distention, or inflammatory changes. Vascular/Lymphatic: Aortic atherosclerosis. No enlarged abdominal or pelvic lymph nodes. Reproductive: Status post hysterectomy. No adnexal masses. Other: There is some subcutaneous fat  stranding along the low anterior abdominal wall which may represent sequela injections in this location. Alternatively, this could represent sequela of trauma. Musculoskeletal: Extensive lumbar fusion hardware is noted. The left L2 pedicle screw appears to be fractured. IMPRESSION: 1. No acute intra-abdominal abnormality. 2. A 1.8 cm airspace opacity in the left lower lobe is suspicious for a developing infiltrate. A three-month follow-up CT of the chest is recommended to confirm resolution of this finding. 3. Hepatic steatosis. Electronically Signed   By: Constance Holster M.D.   On: 03/05/2019 01:27     EKG: Independently reviewed.  Seems to be sinus rhythm, QTC 540, low voltage, bifascicular block.    Assessment/Plan Principal Problem:   Fall Active Problems:   Chronic diastolic (congestive) heart failure (HCC)   Atrial fibrillation, chronic   CKD (chronic kidney disease), stage III (HCC)   COPD (chronic obstructive pulmonary disease) (HCC)   Depression   GERD (gastroesophageal reflux disease)   Gout   HTN (hypertension)   OSA on CPAP  Type II diabetes mellitus with renal manifestations (HCC)   UTI (urinary tract infection)   Fall and generalized weakness.  Etiology is not clear.  Possibly due to multiple chronic comorbidities.  She seems to have worsening renal function.  Currently patient has difficulty walking due to generalized weakness.  CT head is negative for acute intracranial abnormalities.  Likely due to UTI.  Patient does not have focal neurologic findings on physical examination. Will d/c MRI of brain which is ordered by EDP.  -will place on tele bed for obs -pt/OT -check ortho static status  Possible UTI: Urinalysis showed turbid appearance, WBC> 50, few bacteria, large amount of leukocyte, positive nitrates -IV Rocephin -Blood culture urine culture  Chronic diastolic (congestive) heart failure (HCC): 2D echo on 01/05/2015 showed EF of 55-60%.  Patient does not  have worsening shortness of breath.  No JVD.  CHF seem to be compensated. -Hold Lasix due to worsening renal function -check BNP  Atrial fibrillation, chronic:  CHA2DS2-VASc Score is 6, needs oral anticoagulation. Patient is on Coumadin at home. INR is  on admission. Heart rate is well controlled. -hold coumadin due to right flank hematoma -continue metoprolol  CKD (chronic kidney disease), stage III (HCC): worsening than base line. Baseline Cre is 0.93 on 05/28/18, pt's Cre is 1.30 and BUN 40 on admission. Likely due to prerenal secondary to dehydration and continuation of ARB, diuretics. - Follow up renal function by BMP - Hold lasix and Cozarr (pt states that she is not taking Cozarr)  COPD (chronic obstructive pulmonary disease) (HCC): stable. CT-abdomen/pelvis showed possible LLL infiltration, but pt does not have fever or worsening shortness of breath or cough.  She has mild leukocytosis with WBC 10.6. Clinically does not seem to have pneumonia.  Will hold off antibiotics unless patient develops fever or worsening leukocytosis. -Continue bronchodilators and Singulair  Depression: -hold BuSpar due to QT prolongation 540  GERD (gastroesophageal reflux disease): -Protonix  Gout: -Continue colchicine  HTN:  -Continue home medications: Amlodipine -IV hydralazine prn  OSA - on CPAP  Type II diabetes mellitus with renal manifestations [facility](HCC): Patient is taking metformin and Levemir at home.  I could not find A1c data available on record. Blood sugar 192. -will decrease Levemir dose from 50 to 25 units daily -SSI     DVT ppx: SCD Code Status: DNR (I discussed with patient, and explained the meaning of CODE STATUS. Patient states understand it and that she has living will and wants to be DNR. This may need to be confirmed with POA in AM). Family Communication: None at bed side.    Disposition Plan:  Anticipate discharge back to previous home environment Consults called:  none  Admission status: Obs / tele   Date of Service 03/05/2019    Lorretta HarpXilin Cylus [place] [facility]   If 7PM-7AM, please contact night-coverage [email or web address] Password [email or web address] 03/05/2019, 4:10 AM

## 2019-03-05 NOTE — Progress Notes (Signed)
Pt. Refused cpap. Pt. States she just wants to wear her oxygen. RT informed pt. To notify if she changes her mind.

## 2019-03-05 NOTE — Progress Notes (Signed)
0925 Received pt from ED. A&O x3. Pleasant and cooperative. A large bruise with dry abrasions noted to her right back. Denies pain at this time.

## 2019-03-05 NOTE — ED Notes (Signed)
PT in imaging will attempt to obtain urine specimen when PT returns.

## 2019-03-06 DIAGNOSIS — W19XXXD Unspecified fall, subsequent encounter: Secondary | ICD-10-CM

## 2019-03-06 LAB — CBC
HCT: 32.8 % — ABNORMAL LOW (ref 36.0–46.0)
Hemoglobin: 10.6 g/dL — ABNORMAL LOW (ref 12.0–15.0)
MCH: 32 pg (ref 26.0–34.0)
MCHC: 32.3 g/dL (ref 30.0–36.0)
MCV: 99.1 fL (ref 80.0–100.0)
Platelets: 139 10*3/uL — ABNORMAL LOW (ref 150–400)
RBC: 3.31 MIL/uL — ABNORMAL LOW (ref 3.87–5.11)
RDW: 15 % (ref 11.5–15.5)
WBC: 6 10*3/uL (ref 4.0–10.5)
nRBC: 0 % (ref 0.0–0.2)

## 2019-03-06 LAB — GLUCOSE, CAPILLARY
Glucose-Capillary: 167 mg/dL — ABNORMAL HIGH (ref 70–99)
Glucose-Capillary: 95 mg/dL (ref 70–99)
Glucose-Capillary: 139 mg/dL — ABNORMAL HIGH (ref 70–99)
Glucose-Capillary: 142 mg/dL — ABNORMAL HIGH (ref 70–99)

## 2019-03-06 LAB — BASIC METABOLIC PANEL
Anion gap: 11 (ref 5–15)
BUN: 32 mg/dL — ABNORMAL HIGH (ref 8–23)
CO2: 21 mmol/L — ABNORMAL LOW (ref 22–32)
Calcium: 8.7 mg/dL — ABNORMAL LOW (ref 8.9–10.3)
Chloride: 108 mmol/L (ref 98–111)
Creatinine, Ser: 1.17 mg/dL — ABNORMAL HIGH (ref 0.44–1.00)
GFR calc Af Amer: 52 mL/min — ABNORMAL LOW (ref >60)
GFR calc non Af Amer: 45 mL/min — ABNORMAL LOW (ref >60)
Glucose, Bld: 129 mg/dL — ABNORMAL HIGH (ref 70–99)
Potassium: 4.1 mmol/L (ref 3.5–5.1)
Sodium: 140 mmol/L (ref 135–145)

## 2019-03-06 LAB — PROTIME-INR
INR: 1.2 (ref 0.8–1.2)
Prothrombin Time: 15.5 seconds — ABNORMAL HIGH (ref 11.4–15.2)

## 2019-03-06 MED ORDER — WARFARIN SODIUM 5 MG PO TABS
5.0000 mg | ORAL_TABLET | Freq: Once | ORAL | Status: AC
  Administered 2019-03-06: 18:00:00 5 mg via ORAL
  Filled 2019-03-06: qty 1

## 2019-03-06 MED ORDER — ENOXAPARIN SODIUM 80 MG/0.8ML ~~LOC~~ SOLN
80.0000 mg | SUBCUTANEOUS | Status: AC
  Administered 2019-03-06 – 2019-03-07 (×2): 80 mg via SUBCUTANEOUS
  Filled 2019-03-06 (×2): qty 0.8

## 2019-03-06 MED ORDER — ENOXAPARIN SODIUM 80 MG/0.8ML ~~LOC~~ SOLN
80.0000 mg | Freq: Two times a day (BID) | SUBCUTANEOUS | Status: DC
  Administered 2019-03-07 – 2019-03-08 (×3): 80 mg via SUBCUTANEOUS
  Filled 2019-03-06 (×4): qty 0.8

## 2019-03-06 NOTE — Progress Notes (Signed)
Occupational Therapy Evaluation Patient Details Name: Lauren Holland MRN: [id number] DOB: [birth year] Today's Date: 03/06/2019    History of Present Illness Pt is a 78 y/o female admitted secondary to weakness and multiple falls. Pending workup. CT of head negative for acute abnormality. PMH includes DM, dCHF, OSA on CPAP, a fib, CKD, COPD, gout, and HTN.    Clinical Impression   Pt admitted with above diagnosis. PTA pt PLOF requiring assistance in home setting for ADLs. Pt currently requires assistance in functional transfer due to instability, weakness, and pain. Pt educated of safe home environment by removing safety hazards. Pt will benefit from continued acute OT address fall prevention and safe engagement in ADLs prior to d/c at SNF level.     Follow Up Recommendations  SNF;Supervision/Assistance - 24 hour    Equipment Recommendations  3 in 1 bedside commode    Recommendations for Other Services       Precautions / Restrictions Precautions Precautions: Fall Precaution Comments: Pt with multiple falls within the past couple of days.  Restrictions Weight Bearing Restrictions: No      Mobility Bed Mobility Overal bed mobility: Needs Assistance Bed Mobility: Supine to Sit;Sit to Supine     Supine to sit: Modified independent (Device/Increase time) Sit to supine: Modified independent (Device/Increase time)   General bed mobility comments: Pt demonstrated Mod I bed mobility with increased time for trunk elevation. Good use of BUE strength. HOB elevated.  Transfers Overall transfer level: Needs assistance Equipment used: Rolling walker (2 wheeled) Transfers: Sit to/from Stand Sit to Stand: Max assist         General transfer comment: Pt requiring max A to stand with RW. Cues for safe hand placement. Pt unsuccessful to stand right up due to weakness and tremors.     Balance Overall balance assessment: Needs assistance Sitting-balance support: No upper extremity  supported;Feet supported Sitting balance-Leahy Scale: Fair     Standing balance support: Bilateral upper extremity supported;During functional activity Standing balance-Leahy Scale: Zero Standing balance comment: Reliant on max A and BUE support                            ADL either performed or assessed with clinical judgement   ADL Overall ADL's : Needs assistance/impaired Eating/Feeding: Set up;Sitting   Grooming: Set up;Sitting   Upper Body Bathing: Modified independent;Sitting   Lower Body Bathing: Supervison/ safety;Sitting/lateral leans   Upper Body Dressing : Modified independent;Sitting   Lower Body Dressing: Supervision/safety;Sitting/lateral leans   Toilet Transfer: Maximal assistance;Cueing for safety Toilet Transfer Details (indicate cue type and reason): pt requires max A to power up to stand with RW.         Functional mobility during ADLs: Maximal assistance General ADL Comments: Functional transfer deferred due to limitation function to maintain standing balance     Vision Baseline Vision/History: Wears glasses       Perception     Praxis      Pertinent Vitals/Pain Pain Assessment: Faces Faces Pain Scale: Hurts a little bit Pain Location: R ankle  Pain Descriptors / Indicators: Aching;Grimacing;Guarding Pain Intervention(s): Monitored during session;Repositioned     Hand Dominance Left   Extremity/Trunk Assessment Upper Extremity Assessment Upper Extremity Assessment: Generalized weakness   Lower Extremity Assessment Lower Extremity Assessment: Defer to PT evaluation RLE Deficits / Details: R ankle pain upon admission.    Cervical / Trunk Assessment Cervical / Trunk Assessment: Kyphotic   Communication Communication Communication: No  difficulties   Cognition Arousal/Alertness: Awake/alert Behavior During Therapy: WFL for tasks assessed/performed Overall Cognitive Status: No family/caregiver present to determine baseline  cognitive functioning                                     General Comments  Pt agrees to SNF setting and wants to receive the help needed to be safe.    Exercises     Shoulder Instructions      Home Living Family/patient expects to be discharged to:: Private residence Living Arrangements: Alone Available Help at Discharge: Family;Available PRN/intermittently Type of Home: House Home Access: Level entry     Home Layout: One level     Bathroom Shower/Tub: Producer, television/film/videoWalk-in shower   Bathroom Toilet: Handicapped height     Home Equipment: Environmental consultantWalker - 2 wheels;Wheelchair - manual   Additional Comments: pt reports not having 24 hr support while at home. However, she is able to contact son as needed.. Pt educated of safe home environment, removing throw rugs and safety hazards.      Prior Functioning/Environment Level of Independence: Independent with assistive device(s)        Comments: Had been using WC secondary to R ankle pain. Has had multiple falls over last few days.        OT Problem List: Decreased strength;Decreased activity tolerance;Impaired balance (sitting and/or standing);Decreased safety awareness;Decreased knowledge of use of DME or AE;Decreased knowledge of precautions;Pain      OT Treatment/Interventions: Self-care/ADL training;Therapeutic exercise;Energy conservation;Therapeutic activities;Patient/family education;Balance training    OT Goals(Current goals can be found in the care plan section) Acute Rehab OT Goals Patient Stated Goal: to get stronger and to stop falling OT Goal Formulation: With patient Time For Goal Achievement: 03/20/19 Potential to Achieve Goals: Fair  OT Frequency: Min 2X/week   Barriers to D/C: Decreased caregiver support          Co-evaluation              AM-PAC OT "6 Clicks" Daily Activity     Outcome Measure Help from another person eating meals?: A Little Help from another person taking care of personal  grooming?: A Little Help from another person toileting, which includes using toliet, bedpan, or urinal?: A Lot Help from another person bathing (including washing, rinsing, drying)?: A Lot Help from another person to put on and taking off regular upper body clothing?: None Help from another person to put on and taking off regular lower body clothing?: A Little 6 Click Score: 17   End of Session Equipment Utilized During Treatment: Gait belt;Rolling walker Nurse Communication: Mobility status  Activity Tolerance: Patient limited by fatigue Patient left: in bed;with call bell/phone within reach;with bed alarm set  OT Visit Diagnosis: Unsteadiness on feet (R26.81);Repeated falls (R29.6);Muscle weakness (generalized) (M62.81);Pain                Time: 1330-1350 OT Time Calculation (min): 20 min Charges:  OT General Charges $OT Visit: 1 Visit OT Evaluation $OT Eval Moderate Complexity: 1 Mod  [place] OldEvan Stepfanie Yott, MSOT, OTR/L  [facility]  [phone]   Zigmund Danielvan M Nakhia Levitan 03/06/2019, 2:02 PM

## 2019-03-06 NOTE — Plan of Care (Signed)
  Problem: Education: Goal: Knowledge of General Education information will improve Description: Including pain rating scale, medication(s)/side effects and non-pharmacologic comfort measures Outcome: Progressing   Problem: Clinical Measurements: Goal: Cardiovascular complication will be avoided Outcome: Progressing   Problem: Nutrition: Goal: Adequate nutrition will be maintained Outcome: Progressing   Problem: Elimination: Goal: Will not experience complications related to bowel motility Outcome: Progressing   

## 2019-03-06 NOTE — [place] FL2 (Signed)
[place] MEDICAID FL2 LEVEL OF CARE SCREENING TOOL     IDENTIFICATION  Patient Name: Lauren Holland Birthdate: [birth year] Sex: female Admission Date (Current Location): 03/04/2019  [facility] [place]  [place]   Facility and Address:  [facility] [place], [address], [place], [place] [address]      Provider Number: [id number]  Attending Physician Name and Address:  Zannie CoveJoseph, Preetha, MD  Relative Name and Phone Number:  Aurther Lofterry (son)[phone]    Current Level of Care: Hospital Recommended Level of Care: Skilled Nursing Facility Prior Approval Number:    Date Approved/Denied:   PASRR Number: [id number] [id number]  Discharge Plan: SNF    Current Diagnoses: Patient Active Problem List   Diagnosis Date Noted  . Fall 03/05/2019  . UTI (urinary tract infection) 03/05/2019  . Generalized weakness 03/05/2019  . Chronic diastolic (congestive) heart failure (HCC)   . Atrial fibrillation, chronic   . CKD (chronic kidney disease), stage III (HCC)   . COPD (chronic obstructive pulmonary disease) (HCC)   . Depression   . GERD (gastroesophageal reflux disease)   . Gout   . HLD (hyperlipidemia)   . HTN (hypertension)   . OSA on CPAP   . Type II diabetes mellitus with renal manifestations (HCC)   . Contusion of flank     Orientation RESPIRATION BLADDER Height & Weight     Self, Time, Situation, Place  O2(nasal cannula 1L/min) Incontinent, External catheter Weight: 169 lb 12.1 oz (77 kg) Height:  5' (152.4 cm)  BEHAVIORAL SYMPTOMS/MOOD NEUROLOGICAL BOWEL NUTRITION STATUS      Continent Diet(see discharge summary)  AMBULATORY STATUS COMMUNICATION OF NEEDS Skin   Limited Assist Verbally Other (Comment)(abrasion right back, ecchymosis right back)                       Personal Care Assistance Level of Assistance  Bathing, Feeding, Dressing, Total care Bathing Assistance: Limited assistance Feeding assistance: Independent Dressing Assistance: Limited  assistance Total Care Assistance: Limited assistance   Functional Limitations Info  Sight, Speech, Hearing Sight Info: Adequate Hearing Info: Adequate Speech Info: Adequate    SPECIAL CARE FACTORS FREQUENCY  PT (By licensed PT), OT (By licensed OT)     PT Frequency: min 5x weekly OT Frequency: min 5x weekly            Contractures Contractures Info: Not present    Additional Factors Info  Code Status, Allergies Code Status Info: DNR Allergies Info: Codeine Sulfamethoxazole-trimethoprim Metoclopramide Fenofibrate           Current Medications (03/06/2019):  This is the current hospital active medication list Current Facility-Administered Medications  Medication Dose Route Frequency Provider Last Rate Last Dose  . acetaminophen (TYLENOL) tablet 650 mg  650 mg Oral Q6H PRN Lorretta HarpNiu, Xilin, MD   650 mg at 03/06/19 [id number]   Or  . acetaminophen (TYLENOL) suppository 650 mg  650 mg Rectal Q6H PRN Lorretta HarpNiu, Xilin, MD      . albuterol (PROVENTIL) (2.5 MG/3ML) 0.083% nebulizer solution 3 mL  3 mL Inhalation Q4H PRN Chilton SiGreen, Terri L, RPH      . amLODipine (NORVASC) tablet 2.5 mg  2.5 mg Oral Daily Lorretta HarpNiu, Xilin, MD   2.5 mg at 03/06/19 [id number]  . aspirin chewable tablet 81 mg  81 mg Oral Daily Lorretta HarpNiu, Xilin, MD   81 mg at 03/06/19 [id number]  . cefTRIAXone (ROCEPHIN) 1 g in sodium chloride 0.9 % 100 mL IVPB  1 g Intravenous Daily  Ivor Costa, MD 200 mL/hr at 03/06/19 1000 1 g at 03/06/19 1000  . colchicine tablet 0.6 mg  0.6 mg Oral Daily Ivor Costa, MD   0.6 mg at 03/06/19 8676  . enoxaparin (LOVENOX) injection 80 mg  80 mg Subcutaneous 2 times per day on Thu Minda Ditto, RPH      . [START ON 03/07/2019] enoxaparin (LOVENOX) injection 80 mg  80 mg Subcutaneous BID Minda Ditto, RPH      . fluticasone furoate-vilanterol (BREO ELLIPTA) 100-25 MCG/INH 1 puff  1 puff Inhalation Daily Ivor Costa, MD   1 puff at 03/06/19 0915   And  . umeclidinium bromide (INCRUSE ELLIPTA) 62.5 MCG/INH 1 puff  1 puff Inhalation  Daily Ivor Costa, MD   1 puff at 03/06/19 0915  . hydrALAZINE (APRESOLINE) injection 5 mg  5 mg Intravenous Q2H PRN Ivor Costa, MD      . insulin aspart (novoLOG) injection 0-9 Units  0-9 Units Subcutaneous TID WC Ivor Costa, MD   2 Units at 03/06/19 [phone]  . insulin detemir (LEVEMIR) injection 25 Units  25 Units Subcutaneous QHS Ivor Costa, MD   25 Units at 03/05/19 2204  . metoprolol succinate (TOPROL-XL) 24 hr tablet 100 mg  100 mg Oral Daily Minda Ditto, RPH   100 mg at 03/06/19 0920   And  . metoprolol succinate (TOPROL-XL) 24 hr tablet 50 mg  50 mg Oral QHS Green, Terri L, RPH   50 mg at 03/05/19 2204  . montelukast (SINGULAIR) tablet 10 mg  10 mg Oral QHS Ivor Costa, MD   10 mg at 03/05/19 2204  . nitroGLYCERIN (NITROSTAT) SL tablet 0.4 mg  0.4 mg Sublingual Q5H PRN Ivor Costa, MD      . pantoprazole (PROTONIX) EC tablet 20 mg  20 mg Oral Daily Ivor Costa, MD   20 mg at 03/06/19 9326  . warfarin (COUMADIN) tablet 5 mg  5 mg Oral ONCE-1800 Green, Terri L, RPH      . Warfarin - Pharmacist Dosing Inpatient   Does not apply q1800 Minda Ditto, [facility]         Discharge Medications: Please see discharge summary for a list of discharge medications.  Relevant Imaging Results:  Relevant Lab Results:   Additional Information SSN: [id number]  Alberteen Sam, LCSW

## 2019-03-06 NOTE — Progress Notes (Signed)
[facility] PROGRESS NOTE  Pennie RushingCora Elden WUJ:[id number] DOB: 11/24/1940 DOA: 03/04/2019 PCP: System, Pcp Not In  Assessment/Plan: Fallandgeneralized weakness.Etiology unclear. possible multifactorial. worsening renal function/uti/gout right foot/bilateral osteo knees/infection. Not eating/drinking properly due to recent relocation/adjusting. CT head is negative for acute intracranial abnormalities. Evaluated by neuro who opined leg weakness chronic from long standing arthritis/deconditioning. Urine culture Klebsiella species. Evaluated by PT who recommend snf -gentle IV fluids -rocephin day #2. Awaiting susceptabilities -blood culture no growth  Possible UTI: Urinalysis showed turbid appearance, WBC>50, few bacteria, large amount of leukocyte, positive nitrates. Urine culture as above. She is afebrile and non-toxic appearing -IV Rocephin -follow Blood culture no growth  Chronic diastolic (congestive) heart failure (HCC):2D echo on 01/05/2015 showed EF of 55-60%.Patient does not have worsening shortness of breath.No JVD. CHF remains compensated. BNP 36. Weight stable.  - continue to hold Lasix  -intake and output -daily weight  Atrial fibrillation, chronic:CHA2DS2-VASc Scoreis 6, needs oral anticoagulation. Patient is on Coumadinat home. INR is 1.4 on admission and 1.4 today. Heart rate is well controlled. Hg stable -continue coumadin -continuemetoprolol -monitor cbc  CKD (chronic kidney disease), stage III (HCC):worsening than base line. Baseline Cre is0.93 on 05/28/18, pt's Cre is1.30 and BUN 40on admission. Likely due to prerenal secondary to dehydration and continuation ofARB, diuretics. Creatinine 1.17 which is hight than yesterday but still less than on admission.  Holdinglasix and Cozarr (pt states that she is not taking Cozarr)  COPD (chronic obstructive pulmonary disease) (HCC):stable. CT-abdomen/pelvis showed possible LLL infiltration, but ptdoes not  have fever or worsening shortness of breath or cough. No leukocytosis. Clinically does not seem to have pneumonia.  -Continue bronchodilatorsandSingulair  Depression: -holdBuSpar due to QT prolongation 540  GERD (gastroesophageal reflux disease): -Protonix  Gout: appears somewhat better - Continue colchicine  HTN: fair control -Continue home medications:Amlodipine -IV hydralazine prn -monitor OSA -on CPAP  Type II diabetes mellitus with renal manifestations (HCC):Patient is takingmetformin and Levemirat home. Blood sugar 129 this am -will Levemir dose from 50 to 25 units daily -SSI  Code Status: DNR Family Communication:  Son on phone Disposition Plan: snf   Consultants:    Procedures:    Antibiotics:    HPI/Subjective: 78yo hx chf, afib on coumadin, AV node ablation and BiV pacer, aortic stenosis, dm copd on home o2 admitted generalized weakness. Found to have uti and gout flare with acute on chronic ckdII  Objective: Vitals:   03/06/19 0916 03/06/19 0924  BP:  (!) 128/52  Pulse: 62 67  Resp: 16   Temp:    SpO2: 98%     Intake/Output Summary (Last 24 hours) at 03/06/2019 1207 Last data filed at 03/06/2019 0300 Gross per 24 hour  Intake 579.61 ml  Output 1000 ml  Net -420.39 ml   Filed Weights   03/04/19 1803 03/06/19 0500  Weight: 77.1 kg 77 kg    Exam:   General:  Awake alert no acute distress  Cardiovascular: RRR +murmur right foot a little less swollen  Respiratory: normal effort BS clear bilaterally no wheeze  Abdomen: obese soft +BS no guarding or rebounding  Musculoskeletal: joints without swelling/erythema   Data Reviewed: Basic Metabolic Panel: Recent Labs  Lab 03/04/19 2000 03/05/19 0500 03/06/19 0339  NA 140 142 140  K 4.0 3.7 4.1  CL 103 104 108  CO2 25 24 21*  GLUCOSE 192* 116* 129*  BUN 40* 36* 32*  CREATININE 1.30* 1.08* 1.17*  CALCIUM 9.1 9.3 8.7*   Liver Function Tests: No results  for  input(s): AST, ALT, ALKPHOS, BILITOT, PROT, ALBUMIN in the last 168 hours. No results for input(s): LIPASE, AMYLASE in the last 168 hours. No results for input(s): AMMONIA in the last 168 hours. CBC: Recent Labs  Lab 03/04/19 2000 03/05/19 0500 03/06/19 0339  WBC 10.6* 8.3 6.0  HGB 11.9* 12.7 10.6*  HCT 37.1 39.6 32.8*  MCV 100.0 100.3* 99.1  PLT 146* 139* 139*   Cardiac Enzymes: No results for input(s): CKTOTAL, CKMB, CKMBINDEX, TROPONINI in the last 168 hours. BNP (last 3 results) Recent Labs    03/05/19 0434  BNP 38.6    ProBNP (last 3 results) No results for input(s): PROBNP in the last 8760 hours.  CBG: Recent Labs  Lab 03/05/19 0759 03/05/19 1221 03/05/19 1623 03/05/19 2125 03/06/19 0819  GLUCAP 93 150* 125* 182* 167*    Recent Results (from the past 240 hour(s))  SARS Coronavirus 2 (CEPHEID - Performed in [place] hospital lab), Hosp Order     Status: None   Collection Time: 03/05/19  2:32 AM   Specimen: Nasopharyngeal Swab  Result Value Ref Range Status   SARS Coronavirus 2 NEGATIVE NEGATIVE Final    Comment: (NOTE) If result is NEGATIVE SARS-CoV-2 target nucleic acids are NOT DETECTED. The SARS-CoV-2 RNA is generally detectable in upper and lower  respiratory specimens during the acute phase of infection. The lowest  concentration of SARS-CoV-2 viral copies this assay can detect is 250  copies / mL. A negative result does not preclude SARS-CoV-2 infection  and should not be used as the sole basis for treatment or other  patient management decisions.  A negative result may occur with  improper specimen collection / handling, submission of specimen other  than nasopharyngeal swab, presence of viral mutation(s) within the  areas targeted by this assay, and inadequate number of viral copies  (<250 copies / mL). A negative result must be combined with clinical  observations, patient history, and epidemiological information. If result is  POSITIVE SARS-CoV-2 target nucleic acids are DETECTED. The SARS-CoV-2 RNA is generally detectable in upper and lower  respiratory specimens dur ing the acute phase of infection.  Positive  results are indicative of active infection with SARS-CoV-2.  Clinical  correlation with patient history and other diagnostic information is  necessary to determine patient infection status.  Positive results do  not rule out bacterial infection or co-infection with other viruses. If result is PRESUMPTIVE POSTIVE SARS-CoV-2 nucleic acids MAY BE PRESENT.   A presumptive positive result was obtained on the submitted specimen  and confirmed on repeat testing.  While 2019 novel coronavirus  (SARS-CoV-2) nucleic acids may be present in the submitted sample  additional confirmatory testing may be necessary for epidemiological  and / or clinical management purposes  to differentiate between  SARS-CoV-2 and other Sarbecovirus currently known to infect humans.  If clinically indicated additional testing with an alternate test  methodology [phone]) is advised. The SARS-CoV-2 RNA is generally  detectable in upper and lower respiratory sp ecimens during the acute  phase of infection. The expected result is Negative. Fact Sheet for Patients:  StrictlyIdeas.no Fact Sheet for Healthcare Providers: BankingDealers.co.za This test is not yet approved or cleared by the [place] FDA and has been authorized for detection and/or diagnosis of SARS-CoV-2 by FDA under an Emergency Use Authorization (EUA).  This EUA will remain in effect (meaning this test can be used) for the duration of the COVID-19 declaration under Section 564(b)(1) of the Act, 21 U.S.C.  section 360bbb-3(b)(1), unless the authorization is terminated or revoked sooner. Performed at [facility], [address] [place], [place] [address]   Culture, blood (Routine X 2) w Reflex to ID Panel      Status: None (Preliminary result)   Collection Time: 03/05/19  4:55 AM   Specimen: BLOOD RIGHT WRIST  Result Value Ref Range Status   Specimen Description BLOOD RIGHT WRIST  Final   Special Requests   Final    BOTTLES DRAWN AEROBIC ONLY Blood Culture adequate volume   Culture   Final    NO GROWTH 1 DAY Performed at [facility], [address] [place], [place] [address]    Report Status PENDING  Incomplete  Culture, blood (Routine X 2) w Reflex to ID Panel     Status: None (Preliminary result)   Collection Time: 03/05/19  4:56 AM   Specimen: BLOOD LEFT HAND  Result Value Ref Range Status   Specimen Description BLOOD LEFT HAND  Final   Special Requests   Final    BOTTLES DRAWN AEROBIC ONLY Blood Culture adequate volume   Culture   Final    NO GROWTH 1 DAY Performed at [facility], [address] [place], [place] [address]    Report Status PENDING  Incomplete  Urine Culture     Status: Abnormal (Preliminary result)   Collection Time: 03/05/19  8:17 AM   Specimen: Urine, Random  Result Value Ref Range Status   Specimen Description URINE, RANDOM  Final   Special Requests   Final    NONE Performed at [facility], [address] [place], [place] [address]    Culture >=100,000 COLONIES/mL GRAM NEGATIVE RODS (A)  Final   Report Status PENDING  Incomplete     Studies: Ct Head Wo Contrast  Result Date: 03/04/2019 CLINICAL DATA:  Fall EXAM: CT HEAD WITHOUT CONTRAST TECHNIQUE: Contiguous axial images were obtained from the base of the skull through the vertex without intravenous contrast. COMPARISON:  None. FINDINGS: Brain: No evidence of acute infarction, hemorrhage, hydrocephalus, extra-axial collection or mass lesion/mass effect. Mild periventricular white matter hypodensity. Vascular: No hyperdense vessel or unexpected calcification. Skull: Normal. Negative for fracture or focal lesion. Sinuses/Orbits: No acute finding. Other: None. IMPRESSION: No acute  intracranial pathology.  Small-vessel white matter disease. Electronically Signed   By: Lauralyn PrimesAlex  Bibbey M.D.   On: 03/04/2019 19:37   Ct Abdomen Pelvis W Contrast  Result Date: 03/05/2019 CLINICAL DATA:  Abdominal trauma. EXAM: CT ABDOMEN AND PELVIS WITH CONTRAST TECHNIQUE: Multidetector CT imaging of the abdomen and pelvis was performed using the standard protocol following bolus administration of intravenous contrast. CONTRAST:  80mL OMNIPAQUE IOHEXOL 300 MG/ML  SOLN COMPARISON:  None. FINDINGS: Lower chest: There is a 1.8 cm airspace opacity in the left lower lobe. Hepatobiliary: No focal liver abnormality is seen. There is underlying hepatic steatosis. No gallstones, gallbladder wall thickening, or biliary dilatation. Pancreas: Unremarkable. No pancreatic ductal dilatation or surrounding inflammatory changes. Spleen: Normal in size without focal abnormality. Adrenals/Urinary Tract: Adrenal glands are unremarkable. Kidneys are normal, without renal calculi, focal lesion, or hydronephrosis. Bladder is unremarkable. Stomach/Bowel: Stomach is within normal limits. Appendix appears normal. No evidence of bowel wall thickening, distention, or inflammatory changes. Vascular/Lymphatic: Aortic atherosclerosis. No enlarged abdominal or pelvic lymph nodes. Reproductive: Status post hysterectomy. No adnexal masses. Other: There is some subcutaneous fat stranding along the low anterior abdominal wall which may represent sequela injections in this location. Alternatively,  this could represent sequela of trauma. Musculoskeletal: Extensive lumbar fusion hardware is noted. The left L2 pedicle screw appears to be fractured. IMPRESSION: 1. No acute intra-abdominal abnormality. 2. A 1.8 cm airspace opacity in the left lower lobe is suspicious for a developing infiltrate. A three-month follow-up CT of the chest is recommended to confirm resolution of this finding. 3. Hepatic steatosis. Electronically Signed   By: Katherine Mantlehristopher  Green  M.D.   On: 03/05/2019 01:27    Scheduled Meds: . amLODipine  2.5 mg Oral Daily  . aspirin  81 mg Oral Daily  . colchicine  0.6 mg Oral Daily  . fluticasone furoate-vilanterol  1 puff Inhalation Daily   And  . umeclidinium bromide  1 puff Inhalation Daily  . insulin aspart  0-9 Units Subcutaneous TID WC  . insulin detemir  25 Units Subcutaneous QHS  . metoprolol succinate  100 mg Oral Daily   And  . metoprolol succinate  50 mg Oral QHS  . montelukast  10 mg Oral QHS  . pantoprazole  20 mg Oral Daily  . warfarin  5 mg Oral ONCE-1800  . Warfarin - Pharmacist Dosing Inpatient   Does not apply q1800   Continuous Infusions: . cefTRIAXone (ROCEPHIN)  IV 1 g (03/06/19 1000)    Principal Problem:   Fall Active Problems:   Chronic diastolic (congestive) heart failure (HCC)   Atrial fibrillation, chronic   CKD (chronic kidney disease), stage III (HCC)   Gout   UTI (urinary tract infection)   Generalized weakness   COPD (chronic obstructive pulmonary disease) (HCC)   HTN (hypertension)   Type II diabetes mellitus with renal manifestations (HCC)   Depression   GERD (gastroesophageal reflux disease)   OSA on CPAP    Time spent: 45 minutes   Clydie BraunKaren Jerry Haugen, np [facility] If 7PM-7AM, please contact night-coverage at [email or web address], password [email or web address] 03/06/2019, 12:07 PM  LOS: 1 day

## 2019-03-06 NOTE — Progress Notes (Addendum)
[place]nel[place] for Warfarin and added Lovenox bridge until therapeutic INR Indication: atrial fibrillation  Allergies  Allergen Reactions  . Codeine Palpitations  . Sulfamethoxazole-Trimethoprim Anaphylaxis  . Metoclopramide Other (See Comments)    Tremors   . Fenofibrate Other (See Comments)    Patient refuses to take it   Patient Measurements: Height: 5' (152.4 cm) Weight: 169 lb 12.1 oz (77 kg) IBW/kg (Calculated) : 45.5  Vital Signs: BP: 128/52 (06/25 0924) Pulse Rate: 67 (06/25 0924)  Labs: Recent Labs    03/04/19 2000 03/05/19 0500 03/05/19 0521 03/06/19 0339  HGB 11.9* 12.7  --  10.6*  HCT 37.1 39.6  --  32.8*  PLT 146* 139*  --  139*  LABPROT 16.0*  --  17.2* 15.5*  INR 1.3*  --  1.4* 1.2  CREATININE 1.30* 1.08*  --  1.17*   Estimated Creatinine Clearance: 37.5 mL/min (A) (by C-G formula based on SCr of 1.17 mg/dL (H)).  Medical History: Past Medical History:  Diagnosis Date  . Atrial fibrillation, chronic   . Chronic diastolic (congestive) heart failure ([place])   . CKD (chronic kidney disease), stage III ([place])   . COPD (chronic obstructive pulmonary disease) ([place])   . Depression   . GERD (gastroesophageal reflux disease)   . Gout   . HLD (hyperlipidemia)   . HTN (hypertension)   . LBBB (left bundle branch block)   . OSA on CPAP   . Pacemaker   . Type II diabetes mellitus with renal manifestations (HCC)    Medications:  Scheduled:  . amLODipine  2.5 mg Oral Daily  . aspirin  81 mg Oral Daily  . colchicine  0.6 mg Oral Daily  . fluticasone furoate-vilanterol  1 puff Inhalation Daily   And  . umeclidinium bromide  1 puff Inhalation Daily  . insulin aspart  0-9 Units Subcutaneous TID WC  . insulin detemir  25 Units Subcutaneous QHS  . metoprolol succinate  100 mg Oral Daily   And  . metoprolol succinate  50 mg Oral QHS  . montelukast  10 mg Oral QHS  . pantoprazole  20 mg Oral Daily  . Warfarin - Pharmacist  Dosing Inpatient   Does not apply q1800    Assessment: 34 yoF fell at home, small ecchymoses on back, ok to resume Warfarin  PMH: Afib on Warf 5mg  qd, LD 6/23 at 8p, dCHF, AV node ablation, pacemaker, HTN, hx TIA, DM2, COPD/O2  Goal of Therapy:  INR 2-3 Monitor platelets by anticoagulation protocol: Yes   Today, 03/06/2019 - SCr 1.3 > 1.08 > 1.17, scheduled Ibuprofen d/c - PTA ASA 81mg  continued - INR 1.2 after 5mg  Warfarin - PO intake low   Plan:  Warfarin 5mg  today at 1800 Daily Protime/INR Monitor CBC, s/s bleed  Nyoka Cowden, Geraldine Tesar L 03/06/2019,9:26 AM   1415 Addendum: Lovenox 80mg  SQ bid bridge to INR > 2  Minda Ditto PharmD Phone [phone] 03/06/2019, 2:24 PM

## 2019-03-06 NOTE — TOC Initial Note (Signed)
Transition of Care [facility]) - Initial/Assessment Note    Patient Details  Name: Lauren Holland MRN: [id number] Date of Birth: 02-03-1941  Transition of Care [facility]) CM/SW Contact:    Alberteen Sam, [place] Phone Number: [phone] 03/06/2019, 2:31 PM  Clinical Narrative:                  CSW spoke with patient regarding [place] recommendation of short term rehab at time of discharge. Patient is in agreement however requested CSW reach out to her son Coralyn Mark as patient has just moved here from [place] 3 weeks ago and is unfamiliar with the area or what preferences she may have for SNF facility.   CSW reached out to [place], patient's son, who reports he has been researching facilities and has preference for [facility] or [facility]. He asked CSW to send referrals and keep him updated about bed offers.   Expected Discharge Plan: Skilled Nursing Facility Barriers to Discharge: Continued Medical Work up   Patient Goals and CMS Choice Patient states their goals for this hospitalization and ongoing recovery are:: to go to rehab CMS Medicare.gov Compare Post Acute Care list provided to:: Patient Choice offered to / list presented to : Patient  Expected Discharge Plan and Services Expected Discharge Plan: [place] Choice: [place] arrangements for the past 2 months: Single Family Home                                      Prior Living Arrangements/Services Living arrangements for the past 2 months: Single Family Home Lives with:: Self Patient language and need for interpreter reviewed:: Yes Do you feel safe going back to the place where you live?: No   needs short term rehab before returning home  Need for Family Participation in Patient Care: Yes (Comment) Care giver support system in place?: Yes (comment)   Criminal Activity/Legal Involvement Pertinent to Current Situation/Hospitalization: No -  Comment as needed  Activities of Daily Living      Permission Sought/Granted Permission sought to share information with : Case Manager, Customer service manager, Family Supports Permission granted to share information with : Yes, Verbal Permission Granted  Share Information with NAME: Coralyn Mark  Permission granted to share info w AGENCY: SNFs  Permission granted to share info w Relationship: son  Permission granted to share info w Contact Information: [phone]  Emotional Assessment Appearance:: Other (Comment Required(unable to assess) Attitude/Demeanor/Rapport: Unable to Assess Affect (typically observed): Unable to Assess Orientation: : Oriented to Self, Oriented to Place, Oriented to  Time, Oriented to Situation Alcohol / Substance Use: Not Applicable Psych Involvement: No (comment)  Admission diagnosis:  Recurrent falls [R29.6] Contusion of flank, initial encounter [S30.1XXA] Patient Active Problem List   Diagnosis Date Noted  . Fall 03/05/2019  . UTI (urinary tract infection) 03/05/2019  . Generalized weakness 03/05/2019  . Chronic diastolic (congestive) heart failure ([place])   . Atrial fibrillation, chronic   . CKD (chronic kidney disease), stage III ([place])   . COPD (chronic obstructive pulmonary disease) ([place])   . Depression   . GERD (gastroesophageal reflux disease)   . Gout   . HLD (hyperlipidemia)   . HTN (hypertension)   . OSA on CPAP   . Type II diabetes mellitus with renal manifestations ([place])   . Contusion of flank    PCP:  System, Pcp Not In Pharmacy:   [facility] [place], [place] - [id number] AT Portal to Registered [facility] Sites [address] [place] [id number] Phone: [phone] Fax: [phone]  [id number] [place] - [address] AT [facility] [id number] [place] [id number] Phone: [phone] Fax: [phone]     Social Determinants of  Health (SDOH) Interventions    Readmission Risk Interventions No flowsheet data found.

## 2019-03-07 LAB — URINE CULTURE: Culture: >=100000 — AB

## 2019-03-07 LAB — GLUCOSE, CAPILLARY
Glucose-Capillary: 111 mg/dL — ABNORMAL HIGH (ref 70–99)
Glucose-Capillary: 107 mg/dL — ABNORMAL HIGH (ref 70–99)
Glucose-Capillary: 119 mg/dL — ABNORMAL HIGH (ref 70–99)
Glucose-Capillary: 149 mg/dL — ABNORMAL HIGH (ref 70–99)

## 2019-03-07 LAB — PROTIME-INR
INR: 1.4 — ABNORMAL HIGH (ref 0.8–1.2)
Prothrombin Time: 16.9 seconds — ABNORMAL HIGH (ref 11.4–15.2)

## 2019-03-07 MED ORDER — FUROSEMIDE 20 MG PO TABS
20.0000 mg | ORAL_TABLET | Freq: Two times a day (BID) | ORAL | Status: DC
  Administered 2019-03-07 – 2019-03-08 (×3): 20 mg via ORAL
  Filled 2019-03-07 (×3): qty 1

## 2019-03-07 MED ORDER — POLYETHYLENE GLYCOL 3350 17 G PO PACK
17.0000 g | PACK | Freq: Every day | ORAL | Status: DC
  Administered 2019-03-07 – 2019-03-08 (×2): 17 g via ORAL
  Filled 2019-03-07 (×2): qty 1

## 2019-03-07 MED ORDER — CEPHALEXIN 500 MG PO CAPS: 500.0000 mg | ORAL_CAPSULE | Freq: Three times a day (TID) | ORAL | Status: DC

## 2019-03-07 MED ORDER — POTASSIUM CHLORIDE CRYS ER 20 MEQ PO TBCR
20.0000 meq | EXTENDED_RELEASE_TABLET | Freq: Once | ORAL | Status: AC
  Administered 2019-03-07: 20 meq via ORAL
  Filled 2019-03-07: qty 1

## 2019-03-07 MED ORDER — WARFARIN SODIUM 6 MG PO TABS
6.0000 mg | ORAL_TABLET | Freq: Once | ORAL | Status: AC
  Administered 2019-03-07: 6 mg via ORAL
  Filled 2019-03-07: qty 1

## 2019-03-07 MED ORDER — CEPHALEXIN 250 MG PO CAPS
250.0000 mg | ORAL_CAPSULE | Freq: Three times a day (TID) | ORAL | Status: DC
  Administered 2019-03-07 – 2019-03-08 (×4): 250 mg via ORAL
  Filled 2019-03-07 (×5): qty 1

## 2019-03-07 NOTE — Progress Notes (Addendum)
  Update: family chose [facility], CSW reached out to see when they will have a bed avail. Awaiting response.    CSW consulted with patient's son Coralyn Mark regarding bed offers. Preference of [facility] is not taking new patients, [facility] did accept and CSW informed of 3 other facilities that accepted. Coralyn Mark will review with family and call CSW back with choice.   Ada, [place]

## 2019-03-07 NOTE — Progress Notes (Signed)
Refuses CPAP 

## 2019-03-07 NOTE — Progress Notes (Signed)
PROGRESS NOTE    Lauren Holland  ZOX:[id number] DOB: May 25, 1941 DOA: 03/04/2019 PCP: System, Pcp Not In  Brief Narrative: chronically ill 78 year old female with history of chronic diastolic CHF, permanent atrial fibrillation on warfarin, history of AV nodal ablation, BiV pacer, moderate aortic stenosis, hypertension, TIA, type 2 diabetes mellitus, COPD on 2 to 3 L home O2 presented to the ED with frequent falls for the last couple of weeks, [place].  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 Laurence SlateAroor yesterday will bridge with Lovenox temporarily since INR is significantly subtherapeutic  -Physical therapy, plan for discharge to short-term rehab   Klebsiella UTI -Treated with IV ceftriaxone initially, sensitivities noted, transition to oral Keflex today for 2 more days  Chronic diastolic (congestive) heart failure (HCC):2D echo on 01/05/2015 showed EF of 55-60%. -Appears euvolemic, resume oral Lasix today  Atrial fibrillation, chronic:CHA2DS2-VASc Scoreis 6, -Patient is on Coumadinat home. INR was 1.4 on admission -Continue Coumadin, bridge with Lovenox, continue metoprolol  CKD (chronic kidney disease), stage III (HCC): -Stable, ARB on hold, resumed Lasix today  COPD (chronic obstructive pulmonary disease) (HCC): -On home O2, stable  -CT-abdomen/pelvis showed possible LLL infiltration, but ptdoes not have fever or worsening shortness of breath or cough. No leukocytosis. Clinically does not seem to have pneumonia.  -Continue bronchodilatorsandSingulair  Depression: -holdBuSpar due to QT prolongation 540  GERD (gastroesophageal reflux disease): -Protonix  Gout: appears somewhat better - Continue colchicine  HTN: -Continue amlodipine  OSA -on CPAP  Type II diabetes mellitus with renal manifestations (HCC): -CBG stable, continue Levemir, sliding scale insulin  Code Status: DNR Family Communication:   Discussed with son via telephone 6/24 Disposition Plan:  SNF tomorrow if stable   Procedures:   Antimicrobials:    Subjective: -Feels well, improving, chronic leg weakness is unchanged  Objective: Vitals:   03/06/19 2006 03/07/19 0337 03/07/19 0744 03/07/19 0908  BP: (!) 148/57 (!) 156/62 (!) 161/60   Pulse: 63 61 (!) 59 74  Resp: 16 16 18 18   Temp: 97.8 F (36.6 C) 98 F (36.7 C) 98.1 F (36.7 C)   TempSrc: Oral Oral Oral   SpO2: 99% 97% 96% 100%  Weight:      Height:        Intake/Output Summary (Last 24 hours) at 03/07/2019 1342 Last data filed at 03/07/2019 1300 Gross per 24 hour  Intake 840 ml  Output 1300 ml  Net -460 ml   Filed Weights   03/04/19 1803 03/06/19 0500  Weight: 77.1 kg 77 kg    Examination:  General exam: Elderly frail chronically ill-appearing AAO x3, no distress  respiratory system: Decreased breath sounds at both bases, rest clear Cardiovascular system: S1 & S2 heard, RRR. Gastrointestinal system: Abdomen is nondistended, soft and nontender.Normal bowel sounds heard. Central nervous system: Alert and oriented. No focal neurological deficits. Extremities: Trace edema Skin: No rashes, lesions or ulcers Psychiatry: Judgement  and insight appear normal. Mood & affect appropriate.     Data Reviewed:   CBC: Recent Labs  Lab 03/04/19 2000  03/05/19 0500 03/06/19 0339  WBC 10.6* 8.3 6.0  HGB 11.9* 12.7 10.6*  HCT 37.1 39.6 32.8*  MCV 100.0 100.3* 99.1  PLT 146* 139* 139*   Basic Metabolic Panel: Recent Labs  Lab 03/04/19 2000 03/05/19 0500 03/06/19 0339  NA 140 142 140  K 4.0 3.7 4.1  CL 103 104 108  CO2 25 24 21*  GLUCOSE 192* 116* 129*  BUN 40* 36* 32*  CREATININE 1.30* 1.08* 1.17*  CALCIUM 9.1 9.3 8.7*   GFR: Estimated Creatinine Clearance: 37.5 mL/min (A) (by C-G formula based on SCr of 1.17 mg/dL (H)). Liver Function Tests: No results for input(s): AST, ALT, ALKPHOS, BILITOT, PROT, ALBUMIN in the last 168 hours. No results for input(s): LIPASE, AMYLASE in the last 168 hours. No results for input(s): AMMONIA in the last 168 hours. Coagulation Profile: Recent Labs  Lab 03/04/19 2000 03/05/19 0521 03/06/19 0339 03/07/19 0215  INR 1.3* 1.4* 1.2 1.4*   Cardiac Enzymes: No results for input(s): CKTOTAL, CKMB, CKMBINDEX, TROPONINI in the last 168 hours. BNP (last 3 results) No results for input(s): PROBNP in the last 8760 hours. HbA1C: Recent Labs    03/05/19 0502  HGBA1C 7.4*   CBG: Recent Labs  Lab 03/06/19 1220 03/06/19 1611 03/06/19 2051 03/07/19 0745 03/07/19 1152  GLUCAP 95 139* 142* 111* 107*   Lipid Profile: No results for input(s): CHOL, HDL, LDLCALC, TRIG, CHOLHDL, LDLDIRECT in the last 72 hours. Thyroid Function Tests: No results for input(s): TSH, T4TOTAL, FREET4, T3FREE, THYROIDAB in the last 72 hours. Anemia Panel: No results for input(s): VITAMINB12, FOLATE, FERRITIN, TIBC, IRON, RETICCTPCT in the last 72 hours. Urine analysis:    Component Value Date/Time   COLORURINE YELLOW 03/05/2019 0306   APPEARANCEUR TURBID (A) 03/05/2019 0306   LABSPEC 1.030 03/05/2019 0306   PHURINE 5.0 03/05/2019 0306   GLUCOSEU 50 (A) 03/05/2019 0306   HGBUR SMALL (A) 03/05/2019 0306   BILIRUBINUR NEGATIVE 03/05/2019 0306   KETONESUR NEGATIVE 03/05/2019 0306   PROTEINUR 100 (A) 03/05/2019  0306   NITRITE POSITIVE (A) 03/05/2019 0306   LEUKOCYTESUR LARGE (A) 03/05/2019 0306   Sepsis Labs: @LABRCNTIP (procalcitonin:4,lacticidven:4)  ) Recent Results (from the past 240 hour(s))  SARS Coronavirus 2 (CEPHEID - Performed [facility]), Hosp Order     Status: None   Collection Time: 03/05/19  2:32 AM   Specimen: Nasopharyngeal Swab  Result Value Ref Range Status   SARS Coronavirus 2 NEGATIVE NEGATIVE Final    Comment: (NOTE) If result is NEGATIVE SARS-CoV-2 target nucleic acids are NOT DETECTED. The SARS-CoV-2 RNA is generally detectable in upper and lower  respiratory specimens during the acute phase of infection. The lowest  concentration of SARS-CoV-2 viral copies this assay can detect is 250  copies / mL. A negative result does not preclude SARS-CoV-2 infection  and should not be used as the sole basis for treatment or other  patient management decisions.  A negative result may occur with  improper specimen collection / handling, submission of specimen other  than nasopharyngeal swab, presence of viral mutation(s) within the  areas targeted by this assay, and inadequate number of viral copies  (<250 copies / mL). A negative result must be combined with clinical  observations, patient history, and epidemiological information. If result is POSITIVE SARS-CoV-2 target nucleic acids are DETECTED. The SARS-CoV-2 RNA is generally  detectable in upper and lower  respiratory specimens dur ing the acute phase of infection.  Positive  results are indicative of active infection with SARS-CoV-2.  Clinical  correlation with patient history and other diagnostic information is  necessary to determine patient infection status.  Positive results do  not rule out bacterial infection or co-infection with other viruses. If result is PRESUMPTIVE POSTIVE SARS-CoV-2 nucleic acids MAY BE PRESENT.   A presumptive positive result was obtained on the submitted specimen  and  confirmed on repeat testing.  While 2019 novel coronavirus  (SARS-CoV-2) nucleic acids may be present in the submitted sample  additional confirmatory testing may be necessary for epidemiological  and / or clinical management purposes  to differentiate between  SARS-CoV-2 and other Sarbecovirus currently known to infect humans.  If clinically indicated additional testing with an alternate test  methodology [phone]) is advised. The SARS-CoV-2 RNA is generally  detectable in upper and lower respiratory sp ecimens during the acute  phase of infection. The expected result is Negative. Fact Sheet for Patients:  StrictlyIdeas.no Fact Sheet for Healthcare Providers: BankingDealers.co.za This test is not yet approved or cleared by the [place] FDA and has been authorized for detection and/or diagnosis of SARS-CoV-2 by FDA under an Emergency Use Authorization (EUA).  This EUA will remain in effect (meaning this test can be used) for the duration of the COVID-19 declaration under Section 564(b)(1) of the Act, 21 U.S.C. section 360bbb-3(b)(1), unless the authorization is terminated or revoked sooner. Performed at [facility], [place] [address], [place], [place] [address]   Culture, blood (Routine X 2) w Reflex to ID Panel     Status: None (Preliminary result)   Collection Time: 03/05/19  4:55 AM   Specimen: BLOOD RIGHT WRIST  Result Value Ref Range Status   Specimen Description BLOOD RIGHT WRIST  Final   Special Requests   Final    BOTTLES DRAWN AEROBIC ONLY Blood Culture adequate volume   Culture   Final    NO GROWTH 2 DAYS Performed at [facility], [place] [address], [place], [place] [address]    Report Status PENDING  Incomplete  Culture, blood (Routine X 2) w Reflex to ID Panel     Status: None (Preliminary result)   Collection Time: 03/05/19  4:56 AM   Specimen: BLOOD LEFT HAND  Result Value Ref Range Status   Specimen  Description BLOOD LEFT HAND  Final   Special Requests   Final    BOTTLES DRAWN AEROBIC ONLY Blood Culture adequate volume   Culture   Final    NO GROWTH 2 DAYS Performed at [facility], [place] [address], [place], [place] [address]    Report Status PENDING  Incomplete  Urine Culture     Status: Abnormal   Collection Time: 03/05/19  8:17 AM   Specimen: Urine, Random  Result Value Ref Range Status   Specimen Description URINE, RANDOM  Final   Special Requests   Final    NONE Performed at [facility], [place] [address] [place], [place] [address]    Culture >=100,000 COLONIES/mL KLEBSIELLA PNEUMONIAE (A)  Final   Report Status 03/07/2019 FINAL  Final   Organism ID, Bacteria KLEBSIELLA PNEUMONIAE (A)  Final      Susceptibility   Klebsiella pneumoniae - MIC*    AMPICILLIN RESISTANT Resistant     CEFAZOLIN <=4 SENSITIVE Sensitive     CEFTRIAXONE <=1 SENSITIVE Sensitive  CIPROFLOXACIN <=0.25 SENSITIVE Sensitive     GENTAMICIN <=1 SENSITIVE Sensitive     IMIPENEM <=0.25 SENSITIVE Sensitive     NITROFURANTOIN 64 INTERMEDIATE Intermediate     TRIMETH/SULFA <=20 SENSITIVE Sensitive     AMPICILLIN/SULBACTAM 8 SENSITIVE Sensitive     PIP/TAZO <=4 SENSITIVE Sensitive     Extended ESBL NEGATIVE Sensitive     * >=100,000 COLONIES/mL KLEBSIELLA PNEUMONIAE         Radiology Studies: No results found.      Scheduled Meds: . amLODipine  2.5 mg Oral Daily  . aspirin  81 mg Oral Daily  . cephALEXin  250 mg Oral Q8H  . colchicine  0.6 mg Oral Daily  . enoxaparin (LOVENOX) injection  80 mg Subcutaneous BID  . fluticasone furoate-vilanterol  1 puff Inhalation Daily   And  . umeclidinium bromide  1 puff Inhalation Daily  . furosemide  20 mg Oral BID  . insulin aspart  0-9 Units Subcutaneous TID WC  . insulin detemir  25 Units Subcutaneous QHS  . metoprolol succinate  100 mg Oral Daily   And  . metoprolol succinate  50 mg Oral QHS  . montelukast  10 mg Oral QHS  .  pantoprazole  20 mg Oral Daily  . polyethylene glycol  17 g Oral Daily  . warfarin  6 mg Oral ONCE-1800  . Warfarin - Pharmacist Dosing Inpatient   Does not apply q1800   Continuous Infusions:   LOS: 2 days    Time spent: 35min    Zannie CovePreetha Jocelyne Reinertsen, MD [facility]  03/07/2019, 1:42 PM

## 2019-03-07 NOTE — Progress Notes (Signed)
Physical Therapy Treatment Patient Details Name: Lauren Holland MRN: [id number] DOB: Feb 12, 1941 Today's Date: 03/07/2019    History of Present Illness Pt is a 78 y/o female admitted secondary to weakness and multiple falls. Pending workup. CT of head negative for acute abnormality. PMH includes DM, dCHF, OSA on CPAP, a fib, CKD, COPD, gout, and HTN.     PT Comments    Patient received in bed, pleasant, agreeable to work with PT. Performed supine LE strengthening exercises independently with cues. Performed bed mobility with modified independence (bed rails, HOB elevated). Requires mod assistance for sit to stand transfers x 4 reps. Difficulty and increased time with getting up to full standing position. Once standing she is able to remain standing with min guard x 30 sec each rep. On last rep patient was able to take 2 side steps up toward head of bed with min assist. Patient will benefit from continued skilled PT to improve transfers, strength and progress ambulation.       Follow Up Recommendations  SNF     Equipment Recommendations  None recommended by PT    Recommendations for Other Services       Precautions / Restrictions Precautions Precautions: Fall Precaution Comments: Pt with multiple falls within the past couple of days.  Restrictions Weight Bearing Restrictions: No    Mobility  Bed Mobility Overal bed mobility: Modified Independent Bed Mobility: Supine to Sit;Sit to Supine     Supine to sit: Modified independent (Device/Increase time) Sit to supine: Modified independent (Device/Increase time)   General bed mobility comments: Pt demonstrated Mod I bed mobility with increased time for trunk elevation. Good use of BUE strength. HOB elevated.  Transfers Overall transfer level: Needs assistance Equipment used: Rolling walker (2 wheeled) Transfers: Sit to/from Stand Sit to Stand: Mod assist         General transfer comment: patient performed sit to stand x 4 reps.  Able to stand for about 30 seconds each time.  Ambulation/Gait Ambulation/Gait assistance: Min assist Gait Distance (Feet): 2 Feet Assistive device: Rolling walker (2 wheeled)   Gait velocity: decreased   General Gait Details: patient able to take 2 side steps along edge of bed, required assist to move walker   Stairs             Wheelchair Mobility    Modified Rankin (Stroke Patients Only)       Balance Overall balance assessment: Needs assistance Sitting-balance support: Feet supported Sitting balance-Leahy Scale: Good     Standing balance support: Bilateral upper extremity supported Standing balance-Leahy Scale: Poor                              Cognition Arousal/Alertness: Awake/alert Behavior During Therapy: WFL for tasks assessed/performed Overall Cognitive Status: Within Functional Limits for tasks assessed                                        Exercises Total Joint Exercises Ankle Circles/Pumps: AROM;10 reps;Both Heel Slides: AROM;10 reps;Both Hip ABduction/ADduction: AROM;Both;10 reps Straight Leg Raises: AROM;Both;10 reps    General Comments        Pertinent Vitals/Pain Pain Assessment: Faces Faces Pain Scale: Hurts a little bit Pain Location: R foot/ankle Pain Descriptors / Indicators: Discomfort Pain Intervention(s): Limited activity within patient's tolerance;Monitored during session    Home Living Family/patient expects to be  discharged to:: Skilled nursing facility Living Arrangements: Alone Available Help at Discharge: Family;Available PRN/intermittently Type of Home: House Home Access: Level entry   Home Layout: One level Home Equipment: Walker - 2 wheels;Wheelchair - manual      Prior Function Level of Independence: Independent with assistive device(s)      Comments: Had been using WC secondary to R ankle pain. Has had multiple falls over last few days.   PT Goals (current goals can now be  found in the care plan section) Acute Rehab PT Goals Patient Stated Goal: to get stronger and to stop falling PT Goal Formulation: With patient Time For Goal Achievement: 03/19/19 Potential to Achieve Goals: Good    Frequency    Min 2X/week      PT Plan Current plan remains appropriate    Co-evaluation              AM-PAC PT "6 Clicks" Mobility   Outcome Measure  Help needed turning from your back to your side while in a flat bed without using bedrails?: A Little Help needed moving from lying on your back to sitting on the side of a flat bed without using bedrails?: A Little Help needed moving to and from a bed to a chair (including a wheelchair)?: Total Help needed standing up from a chair using your arms (e.g., wheelchair or bedside chair)?: A Lot Help needed to walk in hospital room?: Total Help needed climbing 3-5 steps with a railing? : Total 6 Click Score: 11    End of Session Equipment Utilized During Treatment: Gait belt Activity Tolerance: Patient tolerated treatment well;Patient limited by fatigue Patient left: in bed;with bed alarm set;with call bell/phone within reach Nurse Communication: Mobility status PT Visit Diagnosis: Unsteadiness on feet (R26.81);Other abnormalities of gait and mobility (R26.89);Muscle weakness (generalized) (M62.81);Repeated falls (R29.6);Pain Pain - Right/Left: Right Pain - part of body: Ankle and joints of foot     Time: 1350-1425 PT Time Calculation (min) (ACUTE ONLY): 35 min  Charges:  $Therapeutic Exercise: 8-22 mins $Therapeutic Activity: 8-22 mins                     Cougar Imel, PT, GCS 03/07/19,2:46 PM

## 2019-03-07 NOTE — Progress Notes (Signed)
[facility] for Warfarin and added Lovenox bridge until therapeutic INR Indication: atrial fibrillation  Allergies  Allergen Reactions  . Codeine Palpitations  . Sulfamethoxazole-Trimethoprim Anaphylaxis  . Metoclopramide Other (See Comments)    Tremors   . Fenofibrate Other (See Comments)    Patient refuses to take it   Patient Measurements: Height: 5' (152.4 cm) Weight: 169 lb 12.1 oz (77 kg) IBW/kg (Calculated) : 45.5  Vital Signs: Temp: 98 F (36.7 C) (06/26 0337) Temp Source: Oral (06/26 0337) BP: 156/62 (06/26 0337) Pulse Rate: 61 (06/26 0337)  Labs: Recent Labs    03/04/19 2000 03/05/19 0500 03/05/19 0521 03/06/19 0339 03/07/19 0215  HGB 11.9* 12.7  --  10.6*  --   HCT 37.1 39.6  --  32.8*  --   PLT 146* 139*  --  139*  --   LABPROT 16.0*  --  17.2* 15.5* 16.9*  INR 1.3*  --  1.4* 1.2 1.4*  CREATININE 1.30* 1.08*  --  1.17*  --    Estimated Creatinine Clearance: 37.5 mL/min (A) (by C-G formula based on SCr of 1.17 mg/dL (H)).  Medical History: Past Medical History:  Diagnosis Date  . Atrial fibrillation, chronic   . Chronic diastolic (congestive) heart failure ([place])   . CKD (chronic kidney disease), stage III ([place])   . COPD (chronic obstructive pulmonary disease) ([place])   . Depression   . GERD (gastroesophageal reflux disease)   . Gout   . HLD (hyperlipidemia)   . HTN (hypertension)   . LBBB (left bundle branch block)   . OSA on CPAP   . Pacemaker   . Type II diabetes mellitus with renal manifestations ([facility])    Medications:  Scheduled:  . amLODipine  2.5 mg Oral Daily  . aspirin  81 mg Oral Daily  . colchicine  0.6 mg Oral Daily  . enoxaparin (LOVENOX) injection  80 mg Subcutaneous BID  . fluticasone furoate-vilanterol  1 puff Inhalation Daily   And  . umeclidinium bromide  1 puff Inhalation Daily  . insulin aspart  0-9 Units Subcutaneous TID WC  . insulin detemir  25 Units Subcutaneous QHS  . metoprolol  succinate  100 mg Oral Daily   And  . metoprolol succinate  50 mg Oral QHS  . montelukast  10 mg Oral QHS  . pantoprazole  20 mg Oral Daily  . Warfarin - Pharmacist Dosing Inpatient   Does not apply q1800    Assessment: 58 yoF fell at home, small ecchymoses on back, ok to resume Warfarin  PMH: Afib on Warf 5mg  qd, LD 6/23 at 8p, dCHF, AV node ablation, pacemaker, HTN, hx TIA, DM2, COPD/O2  Goal of Therapy:  INR 2-3 Monitor platelets by anticoagulation protocol: Yes   Today, 03/07/2019 - SCr 1.3 > 1.08 > 1.17, scheduled Ibuprofen d/c - PTA ASA 81mg  continued - INR 1.4 after 5, 5mg  Warfarin - CBC 6/25: Hgb 10.6 Plt 139 - PO intake improved   Plan:  Warfarin 6mg  today at 1800 Lovenox 80mg  SQ bid bridge to INR > 2 Daily Protime/INR Monitor CBC, s/s bleed  Kathrina Crosley L 03/07/2019,7:34 AM   1415 Addendum: Lovenox 80mg  SQ bid bridge to INR > 2  Minda Ditto PharmD Phone [phone] 03/07/2019, 7:34 AM

## 2019-03-08 LAB — CBC
HCT: 35.8 % — ABNORMAL LOW (ref 36.0–46.0)
Hemoglobin: 11.6 g/dL — ABNORMAL LOW (ref 12.0–15.0)
MCH: 32 pg (ref 26.0–34.0)
MCHC: 32.4 g/dL (ref 30.0–36.0)
MCV: 98.9 fL (ref 80.0–100.0)
Platelets: 165 10*3/uL (ref 150–400)
RBC: 3.62 MIL/uL — ABNORMAL LOW (ref 3.87–5.11)
RDW: 14.8 % (ref 11.5–15.5)
WBC: 6.2 10*3/uL (ref 4.0–10.5)
nRBC: 0 % (ref 0.0–0.2)

## 2019-03-08 LAB — BASIC METABOLIC PANEL
Anion gap: 11 (ref 5–15)
BUN: 21 mg/dL (ref 8–23)
CO2: 23 mmol/L (ref 22–32)
Calcium: 9.1 mg/dL (ref 8.9–10.3)
Chloride: 106 mmol/L (ref 98–111)
Creatinine, Ser: 0.68 mg/dL (ref 0.44–1.00)
GFR calc Af Amer: >60 mL/min (ref >60)
GFR calc non Af Amer: >60 mL/min (ref >60)
Glucose, Bld: 143 mg/dL — ABNORMAL HIGH (ref 70–99)
Potassium: 4.2 mmol/L (ref 3.5–5.1)
Sodium: 140 mmol/L (ref 135–145)

## 2019-03-08 LAB — GLUCOSE, CAPILLARY
Glucose-Capillary: 123 mg/dL — ABNORMAL HIGH (ref 70–99)
Glucose-Capillary: 118 mg/dL — ABNORMAL HIGH (ref 70–99)

## 2019-03-08 LAB — PROTIME-INR
INR: 1.4 — ABNORMAL HIGH (ref 0.8–1.2)
Prothrombin Time: 17 seconds — ABNORMAL HIGH (ref 11.4–15.2)

## 2019-03-08 MED ORDER — WARFARIN SODIUM 5 MG PO TABS
10.0000 mg | ORAL_TABLET | Freq: Once | ORAL | Status: AC
  Administered 2019-03-08: 10 mg via ORAL
  Filled 2019-03-08: qty 2

## 2019-03-08 MED ORDER — CEPHALEXIN 250 MG PO CAPS: 250.0000 mg | ORAL_CAPSULE | Freq: Three times a day (TID) | ORAL | 0 refills | Status: AC

## 2019-03-08 MED ORDER — ACETAMINOPHEN 325 MG PO TABS: 650.0000 mg | ORAL_TABLET | Freq: Four times a day (QID) | ORAL | Status: AC | PRN

## 2019-03-08 MED ORDER — LEVEMIR FLEXTOUCH 100 UNIT/ML ~~LOC~~ SOPN: 30.0000 [IU] | PEN_INJECTOR | Freq: Every day | SUBCUTANEOUS | Status: AC

## 2019-03-08 MED ORDER — WARFARIN SODIUM 5 MG PO TABS: 7.5000 mg | ORAL_TABLET | Freq: Every day | ORAL | Status: AC

## 2019-03-08 MED ORDER — ENOXAPARIN SODIUM 80 MG/0.8ML ~~LOC~~ SOLN: 80.0000 mg | Freq: Two times a day (BID) | SUBCUTANEOUS | Status: DC

## 2019-03-08 NOTE — Progress Notes (Signed)
RN called [facility] and gave report to Vira Browns, Therapist, sports. Pt notified of transport. IV has been removed her son has been updated. Will continue to monitor. Belongings at bedside

## 2019-03-08 NOTE — Progress Notes (Signed)
[facility] for Warfarin and added Lovenox bridge until therapeutic INR Indication: atrial fibrillation  Allergies  Allergen Reactions  . Codeine Palpitations  . Sulfamethoxazole-Trimethoprim Anaphylaxis  . Metoclopramide Other (See Comments)    Tremors   . Fenofibrate Other (See Comments)    Patient refuses to take it   Patient Measurements: Height: 5' (152.4 cm) Weight: 169 lb 12.1 oz (77 kg) IBW/kg (Calculated) : 45.5  Vital Signs: Temp: 97.4 F (36.3 C) (06/27 0535) Temp Source: Oral (06/27 0535) BP: 158/69 (06/27 0535) Pulse Rate: 64 (06/27 0535)  Labs: Recent Labs    03/06/19 0339 03/07/19 0215 03/08/19 0452  HGB 10.6*  --  11.6*  HCT 32.8*  --  35.8*  PLT 139*  --  165  LABPROT 15.5* 16.9* 17.0*  INR 1.2 1.4* 1.4*  CREATININE 1.17*  --  0.68   Estimated Creatinine Clearance: 54.9 mL/min (by C-G formula based on SCr of 0.68 mg/dL).  Medical History: Past Medical History:  Diagnosis Date  . Atrial fibrillation, chronic   . Chronic diastolic (congestive) heart failure ([place])   . CKD (chronic kidney disease), stage III ([place])   . COPD (chronic obstructive pulmonary disease) ([place])   . Depression   . GERD (gastroesophageal reflux disease)   . Gout   . HLD (hyperlipidemia)   . HTN (hypertension)   . LBBB (left bundle branch block)   . OSA on CPAP   . Pacemaker   . Type II diabetes mellitus with renal manifestations ([facility])    Medications:  Scheduled:  . amLODipine  2.5 mg Oral Daily  . aspirin  81 mg Oral Daily  . cephALEXin  250 mg Oral Q8H  . colchicine  0.6 mg Oral Daily  . enoxaparin (LOVENOX) injection  80 mg Subcutaneous BID  . fluticasone furoate-vilanterol  1 puff Inhalation Daily   And  . umeclidinium bromide  1 puff Inhalation Daily  . furosemide  20 mg Oral BID  . insulin aspart  0-9 Units Subcutaneous TID WC  . insulin detemir  25 Units Subcutaneous QHS  . metoprolol succinate  100 mg Oral Daily   And   . metoprolol succinate  50 mg Oral QHS  . montelukast  10 mg Oral QHS  . pantoprazole  20 mg Oral Daily  . polyethylene glycol  17 g Oral Daily  . Warfarin - Pharmacist Dosing Inpatient   Does not apply q1800    Assessment: 6 yoF fell at home, small ecchymoses on back, ok to resume Warfarin  PMH: Afib on Warf 5mg  qd, LD 6/23 at 8p, dCHF, AV node ablation, pacemaker, HTN, hx TIA, DM2, COPD/O2  Goal of Therapy:  INR 2-3 Monitor platelets by anticoagulation protocol: Yes   - SCr 1.3 > 1.08 > 1.17 > 0.68, scheduled Ibuprofen d/c  Today, 03/08/2019 - PTA ASA 81mg  continued - INR 1.4 after 5, 5, 6mg  Warfarin - CBC 6/27: Hgb 11.6 Plt 165 - improved - PO intake much improved, likely contributing to Warf resistance - Abx show no effect on INR (abx can incr INR)   Plan:  Warfarin 10mg  today at 1000 Lovenox 80mg  SQ bid bridge to INR > 2 Daily Protime/INR Monitor CBC, s/s bleed  Mishka Stegemann L 03/08/2019,8:47 AM

## 2019-03-08 NOTE — TOC Transition Note (Addendum)
Transition of Care [facility]) - CM/SW Discharge Note   Patient Details  Name: Lauren Holland MRN: [id number] Date of Birth: [birth year]  [facility] CM/SW Contact:  Bary Castilla, LCSW Phone Number: 03/08/2019, 1:18 PM   Clinical Narrative:     Patient will DC [id number]? Anticipated DC date:?03/08/19 Family notified:Son-Terry? Transport by:  Corey Harold   Per MD patient ready for DC to [facility]. RN, patient, patient's family, and facility notified of DC. Discharge Summary sent to facility. RN given number for report  [phone]. DC packet on chart. Ambulance transport requested for patient.   CSW signing off.   [place]ery [place], [place] [phone]     Final next level of care: Skilled Nursing Facility Barriers to Discharge: No Barriers Identified   Patient Goals and CMS Choice Patient states their goals for this hospitalization and ongoing recovery are:: to go to rehab CMS Medicare.gov Compare Post Acute Care list provided to:: Patient Choice offered to / list presented to : Patient  Discharge Placement              Patient chooses bed at: [facility] Patient to be transferred to facility by: [facility] Name of family member notified: Son-Terry Patient and family notified of of transfer: 03/08/19  Discharge Plan and Services     Post Acute Care Choice: [facility]                               Social Determinants of Health (SDOH) Interventions     Readmission Risk Interventions No flowsheet data found.

## 2019-03-08 NOTE — Discharge Summary (Addendum)
Physician Discharge Summary  Pennie RushingCora Godby WUJ:[id number] DOB: 1940/10/12 DOA: 03/04/2019  PCP: System, Pcp Not In  Admit date: 03/04/2019 Discharge date: 03/08/2019  Time spent: 45 minutes  Recommendations for Outpatient Follow-up:  1. New cardiology follow-up in 1 month to establish care in [place] 2. Continue Lovenox bridge with Coumadin until INR close to therapeutic range, goal is 2-3 3. Continue BiPAP nightly, continue 2 L home O2  4. SNF for rehabilitation 5. PCP in 1 week   Discharge Diagnoses:  Principal Problem:   Fall Klebsiella UTI Gait disorder, frequent falls Chronic atrial fibrillation on Coumadin Aortic stenosis   Chronic diastolic (congestive) heart failure (HCC)   Atrial fibrillation, chronic   CKD (chronic kidney disease), stage III (HCC)   COPD (chronic obstructive pulmonary disease) (HCC)   Depression   GERD (gastroesophageal reflux disease)   Gout   HTN (hypertension)   OSA on CPAP   Type II diabetes mellitus with renal manifestations (HCC)   UTI (urinary tract infection)   Generalized weakness   Discharge Condition: Stable  Diet recommendation: Diabetic heart healthy  Filed Weights   03/04/19 1803 03/06/19 0500  Weight: 77.1 kg 77 kg    History of present illness:  chronically ill 78 year old female with history of chronic diastolic CHF, permanent atrial fibrillation on warfarin, history of AV nodal ablation, BiV pacer, moderate aortic stenosis, hypertension, TIA, type 2 diabetes mellitus, COPD on 2 to 3 L home O2 presented to the ED with frequent falls for the last couple of weeks, recently [place]  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, knee as well as gout in her foot, in addition to deconditioning, CT showed chronic  microvascular disease, INR was subtherapeutic at 1.4 , small infarct not visualized on CT was considered however MRI could not be done due to her pacemaker and since she has valvular atrial fibrillation management would not be different other than Coumadin hence Coumadin was continued, seen by Neurology this admission -Noted to have mild contusion, bruise on her back after a fall however no significant bleeding, anticoagulation continued due to stroke risk -Appreciate neurology input, discussed with Neurology will bridge with Lovenox temporarily since INR is significantly subtherapeutic  -Physical therapy, plan for discharge to short-term rehab   Klebsiella UTI -Treated with IV ceftriaxone initially, sensitivities noted, subsequently transitioned to oral Keflex, needs 1 more day to complete course  Chronic diastolic (congestive) heart failure (HCC):2D echo on 01/05/2015 showed EF of 55-60%. -Appears euvolemic, resumed oral Lasix with potassium  Atrial fibrillation, chronic:CHA2DS2-VASc Scoreis 6, -history of AV nodal ablation, BiV pacer, moderate aortic stenosis -Patient is on Coumadinat home. INR was1.4on admission -Continue Coumadin, bridge with Lovenox, continue metoprolol -Continue Lovenox at discharge for probably a couple more days until INR is close to therapeutic range -Needs cardiology follow-up in 1 month, needs to [place] care here in [place]  CKD (chronic kidney disease), stage III (HCC): -Stable,  resumed Lasix  COPD (chronic obstructive pulmonary disease) (HCC): -On home O2 at 2 L, stable -CT-abdomen/pelvis showed possible LLL infiltration, but ptdoes not have fever or worsening shortness of breath or cough.No leukocytosis.Clinically does not seem to have pneumonia.  -Continue bronchodilatorsandSingulair  GERD (gastroesophageal reflux disease): -Protonix  Gout:appears somewhat better - Continue colchicine  HTN: -Continue  amlodipine  OSA -on BiPAP nightly  20 x 16 with O2  Type II diabetes mellitus with renal manifestations (HCC): -CBG stable, continue Levemir, sliding scale insulin  Code Status:DNR  Discharge Exam: Vitals:   03/08/19 0855 03/08/19 0950  BP:  (!) 156/62  Pulse:  62  Resp:  14  Temp:  97.6 F (36.4 C)  SpO2: 97% 97%    General: AAOx3 Cardiovascular: S1S2/RRR diastolic murmur Respiratory: Clear bilaterally  Discharge Instructions   Discharge Instructions    Diet - low sodium heart healthy   Complete by: As directed    Diet Carb Modified   Complete by: As directed    Increase activity slowly   Complete by: As directed      Allergies as of 03/08/2019      Reactions   Codeine Palpitations   Sulfamethoxazole-trimethoprim Anaphylaxis   Metoclopramide Other (See Comments)   Tremors   Fenofibrate Other (See Comments)   Patient refuses to take it      Medication List    STOP taking these medications   aspirin 81 MG chewable tablet     TAKE these medications   acetaminophen 325 MG tablet Commonly known as: TYLENOL Take 2 tablets (650 mg total) by mouth every 6 (six) hours as needed for mild pain (or Fever >/= 101).   amLODipine 2.5 MG tablet Commonly known as: NORVASC Take 2.5 mg by mouth daily.   busPIRone 10 MG tablet Commonly known as: BUSPAR Take 10 mg by mouth 2 (two) times a day.   cephALEXin 250 MG capsule Commonly known as: KEFLEX Take 1 capsule (250 mg total) by mouth every 8 (eight) hours for 1 day.   colchicine 0.6 MG tablet Take 1 tablet (0.6 mg total) by mouth daily.   enoxaparin 80 MG/0.8ML injection Commonly known as: LOVENOX Inject 0.8 mLs (80 mg total) into the skin 2 (two) times daily for 2 days.   Fluticasone-Umeclidin-Vilant 100-62.5-25 MCG/INH Aepb Inhale 1 puff into the lungs daily.   furosemide 20 MG tablet Commonly known as: LASIX Take 20 mg by mouth 2 (two) times a day.   lansoprazole 30 MG capsule Commonly known  as: PREVACID Take 30 mg by mouth daily.   Levemir FlexTouch 100 UNIT/ML Pen Generic drug: Insulin Detemir Inject 30 Units into the skin at bedtime. What changed: how much to take   metFORMIN 500 MG tablet Commonly known as: GLUCOPHAGE Take 500 mg by mouth daily.   metoprolol succinate 50 MG 24 hr tablet Commonly known as: TOPROL-XL Take 50-100 mg by mouth See admin instructions. 100mg  in the morning and 50mg  at bedtime   montelukast 10 MG tablet Commonly known as: SINGULAIR Take 10 mg by mouth at bedtime.   nitroGLYCERIN 0.4 MG SL tablet Commonly known as: NITROSTAT Place 1 tablet under the tongue every 5 (five) hours as needed for chest pain.   potassium chloride 10 MEQ tablet Commonly known as: K-DUR Take 10 mEq by mouth 2 (two) times a day.   warfarin 5 MG tablet Commonly known as: COUMADIN Take 1.5 tablets (7.5 mg total) by mouth daily. Take 7.5mg  for 2days then resume 5mg  daily for INR 2-3 What changed:   how much to take  additional instructions      Allergies  Allergen Reactions  . Codeine Palpitations  . Sulfamethoxazole-Trimethoprim Anaphylaxis  . Metoclopramide Other (See Comments)    Tremors   . Fenofibrate Other (See Comments)    Patient refuses to take it      The results of significant diagnostics from  this hospitalization (including imaging, microbiology, ancillary and laboratory) are listed below for reference.    Significant Diagnostic Studies: Dg Ankle Complete Right  Result Date: 03/03/2019 CLINICAL DATA:  Fall EXAM: RIGHT ANKLE - COMPLETE 3+ VIEW COMPARISON:  None. FINDINGS: There is no evidence of fracture, dislocation, or joint effusion. 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 There is no evidence of arthropathy or other focal bone abnormality. Moderate lateral malleolus soft tissue swelling. IMPRESSION: Moderate lateral soft tissue swelling without acute fracture or dislocation. Ossific fragment at the medial  malleolus is age indeterminate, but likely chronic. Correlation for point tenderness recommended, as a small avulsion fracture may also have this appearance. Electronically Signed   By: Deatra RobinsonKevin  Herman M.D.   On: 03/03/2019 20:31   Ct Head Wo Contrast  Result Date: 03/04/2019 CLINICAL DATA:  Fall EXAM: CT HEAD WITHOUT CONTRAST TECHNIQUE: Contiguous axial images were obtained from the base of the skull through the vertex without intravenous contrast. COMPARISON:  None. FINDINGS: Brain: No evidence of acute infarction, hemorrhage, hydrocephalus, extra-axial collection or mass lesion/mass effect. Mild periventricular white matter hypodensity. Vascular: No hyperdense vessel or unexpected calcification. Skull: Normal. Negative for fracture or focal lesion. Sinuses/Orbits: No acute finding. Other: None. IMPRESSION: No acute intracranial pathology.  Small-vessel white matter disease. Electronically Signed   By: Lauralyn PrimesAlex  Bibbey M.D.   On: 03/04/2019 19:37   Ct Abdomen Pelvis W Contrast  Result Date: 03/05/2019 CLINICAL DATA:  Abdominal trauma. EXAM: CT ABDOMEN AND PELVIS WITH CONTRAST TECHNIQUE: Multidetector CT imaging of the abdomen and pelvis was performed using the standard protocol following bolus administration of intravenous contrast. CONTRAST:  80mL OMNIPAQUE IOHEXOL 300 MG/ML  SOLN COMPARISON:  None. FINDINGS: Lower chest: There is a 1.8 cm airspace opacity in the left lower lobe. Hepatobiliary: No focal liver abnormality is seen. There is underlying hepatic steatosis. No gallstones, gallbladder wall thickening, or biliary dilatation. Pancreas: Unremarkable. No pancreatic ductal dilatation or surrounding inflammatory changes. Spleen: Normal in size without focal abnormality. Adrenals/Urinary Tract: Adrenal glands are unremarkable. Kidneys are normal, without renal calculi, focal lesion, or hydronephrosis. Bladder is unremarkable. Stomach/Bowel: Stomach is within normal limits. Appendix appears normal. No  evidence of bowel wall thickening, distention, or inflammatory changes. Vascular/Lymphatic: Aortic atherosclerosis. No enlarged abdominal or pelvic lymph nodes. Reproductive: Status post hysterectomy. No adnexal masses. Other: There is some subcutaneous fat stranding along the low anterior abdominal wall which may represent sequela injections in this location. Alternatively, this could represent sequela of trauma. Musculoskeletal: Extensive lumbar fusion hardware is noted. The left L2 pedicle screw appears to be fractured. IMPRESSION: 1. No acute intra-abdominal abnormality. 2. A 1.8 cm airspace opacity in the left lower lobe is suspicious for a developing infiltrate. A three-month follow-up CT of the chest is recommended to confirm resolution of this finding. 3. Hepatic steatosis. Electronically Signed   By: Katherine Mantlehristopher  Green M.D.   On: 03/05/2019 01:27    Microbiology: Recent Results (from the past 240 hour(s))  SARS Coronavirus 2 (CEPHEID - Performed in [facility] hospital lab), Hosp Order     Status: None   Collection Time: 03/05/19  2:32 AM   Specimen: Nasopharyngeal Swab  Result Value Ref Range Status   SARS Coronavirus 2 NEGATIVE NEGATIVE Final    Comment: (NOTE) If result is NEGATIVE SARS-CoV-2 target nucleic acids are NOT DETECTED. The SARS-CoV-2 RNA is generally detectable in upper and lower  respiratory specimens during the acute phase of infection. The lowest  concentration of SARS-CoV-2 viral copies  this assay can detect is 250  copies / mL. A negative result does not preclude SARS-CoV-2 infection  and should not be used as the sole basis for treatment or other  patient management decisions.  A negative result may occur with  improper specimen collection / handling, submission of specimen other  than nasopharyngeal swab, presence of viral mutation(s) within the  areas targeted by this assay, and inadequate number of viral copies  (<250 copies / mL). A negative result must be  combined with clinical  observations, patient history, and epidemiological information. If result is POSITIVE SARS-CoV-2 target nucleic acids are DETECTED. The SARS-CoV-2 RNA is generally detectable in upper and lower  respiratory specimens dur ing the acute phase of infection.  Positive  results are indicative of active infection with SARS-CoV-2.  Clinical  correlation with patient history and other diagnostic information is  necessary to determine patient infection status.  Positive results do  not rule out bacterial infection or co-infection with other viruses. If result is PRESUMPTIVE POSTIVE SARS-CoV-2 nucleic acids MAY BE PRESENT.   A presumptive positive result was obtained on the submitted specimen  and confirmed on repeat testing.  While 2019 novel coronavirus  (SARS-CoV-2) nucleic acids may be present in the submitted sample  additional confirmatory testing may be necessary for epidemiological  and / or clinical management purposes  to differentiate between  SARS-CoV-2 and other Sarbecovirus currently known to infect humans.  If clinically indicated additional testing with an alternate test  methodology [phone]) is advised. The SARS-CoV-2 RNA is generally  detectable in upper and lower respiratory sp ecimens during the acute  phase of infection. The expected result is Negative. Fact Sheet for Patients:  StrictlyIdeas.no Fact Sheet for Healthcare Providers: BankingDealers.co.za This test is not yet approved or cleared by the [place] FDA and has been authorized for detection and/or diagnosis of SARS-CoV-2 by FDA under an Emergency Use Authorization (EUA).  This EUA will remain in effect (meaning this test can be used) for the duration of the COVID-19 declaration under Section 564(b)(1) of the Act, 21 U.S.C. section 360bbb-3(b)(1), unless the authorization is terminated or revoked sooner. Performed at [facility], [place] [address] [place], [place] [address]   Culture, blood (Routine X 2) w Reflex to ID Panel     Status: None (Preliminary result)   Collection Time: 03/05/19  4:55 AM   Specimen: BLOOD RIGHT WRIST  Result Value Ref Range Status   Specimen Description BLOOD RIGHT WRIST  Final   Special Requests   Final    BOTTLES DRAWN AEROBIC ONLY Blood Culture adequate volume   Culture   Final    NO GROWTH 3 DAYS Performed at [facility], [place] [address] [place], [place] [address]    Report Status PENDING  Incomplete  Culture, blood (Routine X 2) w Reflex to ID Panel     Status: None (Preliminary result)   Collection Time: 03/05/19  4:56 AM   Specimen: BLOOD LEFT HAND  Result Value Ref Range Status   Specimen Description BLOOD LEFT HAND  Final   Special Requests   Final    BOTTLES DRAWN AEROBIC ONLY Blood Culture adequate volume   Culture   Final    NO GROWTH 3 DAYS Performed at [facility], [place] [address], [place], [place] [address]    Report Status PENDING  Incomplete  Urine Culture     Status: Abnormal   Collection Time: 03/05/19  8:17 AM  Specimen: Urine, Random  Result Value Ref Range Status   Specimen Description URINE, RANDOM  Final   Special Requests   Final    NONE Performed at [facility], [address] [place], [place] [address]    Culture >=100,000 COLONIES/mL KLEBSIELLA PNEUMONIAE (A)  Final   Report Status 03/07/2019 FINAL  Final   Organism ID, Bacteria KLEBSIELLA PNEUMONIAE (A)  Final      Susceptibility   Klebsiella pneumoniae - MIC*    AMPICILLIN RESISTANT Resistant     CEFAZOLIN <=4 SENSITIVE Sensitive     CEFTRIAXONE <=1 SENSITIVE Sensitive     CIPROFLOXACIN <=0.25 SENSITIVE Sensitive     GENTAMICIN <=1 SENSITIVE Sensitive     IMIPENEM <=0.25 SENSITIVE Sensitive     NITROFURANTOIN 64 INTERMEDIATE Intermediate     TRIMETH/SULFA <=20 SENSITIVE Sensitive     AMPICILLIN/SULBACTAM 8 SENSITIVE Sensitive     PIP/TAZO <=4 SENSITIVE Sensitive      Extended ESBL NEGATIVE Sensitive     * >=100,000 COLONIES/mL KLEBSIELLA PNEUMONIAE     Labs: Basic Metabolic Panel: Recent Labs  Lab 03/04/19 2000 03/05/19 0500 03/06/19 0339 03/08/19 0452  NA 140 142 140 140  K 4.0 3.7 4.1 4.2  CL 103 104 108 106  CO2 25 24 21* 23  GLUCOSE 192* 116* 129* 143*  BUN 40* 36* 32* 21  CREATININE 1.30* 1.08* 1.17* 0.68  CALCIUM 9.1 9.3 8.7* 9.1   Liver Function Tests: No results for input(s): AST, ALT, ALKPHOS, BILITOT, PROT, ALBUMIN in the last 168 hours. No results for input(s): LIPASE, AMYLASE in the last 168 hours. No results for input(s): AMMONIA in the last 168 hours. CBC: Recent Labs  Lab 03/04/19 2000 03/05/19 0500 03/06/19 0339 03/08/19 0452  WBC 10.6* 8.3 6.0 6.2  HGB 11.9* 12.7 10.6* 11.6*  HCT 37.1 39.6 32.8* 35.8*  MCV 100.0 100.3* 99.1 98.9  PLT 146* 139* 139* 165   Cardiac Enzymes: No results for input(s): CKTOTAL, CKMB, CKMBINDEX, TROPONINI in the last 168 hours. BNP: BNP (last 3 results) Recent Labs    03/05/19 0434  BNP 38.6    ProBNP (last 3 results) No results for input(s): PROBNP in the last 8760 hours.  CBG: Recent Labs  Lab 03/07/19 1152 03/07/19 1713 03/07/19 2053 03/08/19 0748 03/08/19 1149  GLUCAP 107* 119* 149* 123* 118*       Signed:  Zannie Cove MD.  [facility] 03/08/2019, 12:25 PM

## 2019-03-10 LAB — CULTURE, BLOOD (ROUTINE X 2)
Culture: NO GROWTH
Culture: NO GROWTH
Special Requests: ADEQUATE
Special Requests: ADEQUATE

## 2019-03-22 ENCOUNTER — Other Ambulatory Visit: Payer: Self-pay

## 2019-03-22 ENCOUNTER — Observation Stay (HOSPITAL_COMMUNITY)
Admission: EM | Admit: 2019-03-22 | Discharge: 2019-03-24 | Disposition: A | Discharge status: Home / Self Care | Payer: Medicare Other | Attending: Internal Medicine | Admitting: Internal Medicine

## 2019-03-22 ENCOUNTER — Observation Stay (HOSPITAL_COMMUNITY): Payer: Medicare Other

## 2019-03-22 ENCOUNTER — Encounter (HOSPITAL_COMMUNITY): Payer: Self-pay | Admitting: Emergency Medicine

## 2019-03-22 ENCOUNTER — Emergency Department (HOSPITAL_COMMUNITY): Payer: Medicare Other

## 2019-03-22 DIAGNOSIS — E119 Type 2 diabetes mellitus without complications: Secondary | ICD-10-CM | POA: Diagnosis not present

## 2019-03-22 DIAGNOSIS — J449 Chronic obstructive pulmonary disease, unspecified: Secondary | ICD-10-CM | POA: Diagnosis present

## 2019-03-22 DIAGNOSIS — N183 Chronic kidney disease, stage 3 unspecified: Secondary | ICD-10-CM | POA: Diagnosis present

## 2019-03-22 DIAGNOSIS — Z9989 Dependence on other enabling machines and devices: Secondary | ICD-10-CM

## 2019-03-22 DIAGNOSIS — I130 Hypertensive heart and chronic kidney disease with heart failure and stage 1 through stage 4 chronic kidney disease, or unspecified chronic kidney disease: Secondary | ICD-10-CM | POA: Insufficient documentation

## 2019-03-22 DIAGNOSIS — Z20828 Contact with and (suspected) exposure to other viral communicable diseases: Secondary | ICD-10-CM | POA: Insufficient documentation

## 2019-03-22 DIAGNOSIS — G4733 Obstructive sleep apnea (adult) (pediatric): Secondary | ICD-10-CM

## 2019-03-22 DIAGNOSIS — I482 Chronic atrial fibrillation, unspecified: Secondary | ICD-10-CM | POA: Diagnosis present

## 2019-03-22 DIAGNOSIS — F32A Depression, unspecified: Secondary | ICD-10-CM | POA: Diagnosis present

## 2019-03-22 DIAGNOSIS — K219 Gastro-esophageal reflux disease without esophagitis: Secondary | ICD-10-CM | POA: Diagnosis present

## 2019-03-22 DIAGNOSIS — R079 Chest pain, unspecified: Secondary | ICD-10-CM | POA: Diagnosis present

## 2019-03-22 DIAGNOSIS — R002 Palpitations: Principal | ICD-10-CM | POA: Diagnosis present

## 2019-03-22 DIAGNOSIS — E785 Hyperlipidemia, unspecified: Secondary | ICD-10-CM | POA: Diagnosis not present

## 2019-03-22 DIAGNOSIS — F329 Major depressive disorder, single episode, unspecified: Secondary | ICD-10-CM | POA: Diagnosis not present

## 2019-03-22 DIAGNOSIS — M109 Gout, unspecified: Secondary | ICD-10-CM | POA: Diagnosis present

## 2019-03-22 DIAGNOSIS — Z7901 Long term (current) use of anticoagulants: Secondary | ICD-10-CM | POA: Insufficient documentation

## 2019-03-22 DIAGNOSIS — I10 Essential (primary) hypertension: Secondary | ICD-10-CM | POA: Diagnosis present

## 2019-03-22 DIAGNOSIS — I5032 Chronic diastolic (congestive) heart failure: Secondary | ICD-10-CM | POA: Diagnosis present

## 2019-03-22 DIAGNOSIS — Z79899 Other long term (current) drug therapy: Secondary | ICD-10-CM | POA: Insufficient documentation

## 2019-03-22 DIAGNOSIS — E1129 Type 2 diabetes mellitus with other diabetic kidney complication: Secondary | ICD-10-CM | POA: Diagnosis present

## 2019-03-22 LAB — BASIC METABOLIC PANEL
Anion gap: 11 (ref 5–15)
BUN: 15 mg/dL (ref 8–23)
CO2: 25 mmol/L (ref 22–32)
Calcium: 9.4 mg/dL (ref 8.9–10.3)
Chloride: 105 mmol/L (ref 98–111)
Creatinine, Ser: 0.84 mg/dL (ref 0.44–1.00)
GFR calc Af Amer: >60 mL/min (ref >60)
GFR calc non Af Amer: >60 mL/min (ref >60)
Glucose, Bld: 99 mg/dL (ref 70–99)
Potassium: 3.7 mmol/L (ref 3.5–5.1)
Sodium: 141 mmol/L (ref 135–145)

## 2019-03-22 LAB — CBC
HCT: 39.2 % (ref 36.0–46.0)
Hemoglobin: 12.5 g/dL (ref 12.0–15.0)
MCH: 32.3 pg (ref 26.0–34.0)
MCHC: 31.9 g/dL (ref 30.0–36.0)
MCV: 101.3 fL — ABNORMAL HIGH (ref 80.0–100.0)
Platelets: 183 10*3/uL (ref 150–400)
RBC: 3.87 MIL/uL (ref 3.87–5.11)
RDW: 15.3 % (ref 11.5–15.5)
WBC: 6.5 10*3/uL (ref 4.0–10.5)
nRBC: 0 % (ref 0.0–0.2)

## 2019-03-22 LAB — TROPONIN I (HIGH SENSITIVITY)
Troponin I (High Sensitivity): 9 ng/L (ref <18)
Troponin I (High Sensitivity): 8 ng/L (ref <18)

## 2019-03-22 LAB — SARS CORONAVIRUS 2 BY RT PCR (HOSPITAL ORDER, PERFORMED IN ~~LOC~~ HOSPITAL LAB): SARS Coronavirus 2: NEGATIVE

## 2019-03-22 LAB — BRAIN NATRIURETIC PEPTIDE: B Natriuretic Peptide: 90.1 pg/mL (ref 0.0–100.0)

## 2019-03-22 LAB — PROTIME-INR
INR: 2.1 — ABNORMAL HIGH (ref 0.8–1.2)
Prothrombin Time: 23.6 seconds — ABNORMAL HIGH (ref 11.4–15.2)

## 2019-03-22 LAB — GLUCOSE, CAPILLARY
Glucose-Capillary: 74 mg/dL (ref 70–99)
Glucose-Capillary: 107 mg/dL — ABNORMAL HIGH (ref 70–99)

## 2019-03-22 LAB — MRSA PCR SCREENING: MRSA by PCR: POSITIVE — AB

## 2019-03-22 LAB — D-DIMER, QUANTITATIVE: D-Dimer, Quant: 1.47 ug/mL-FEU — ABNORMAL HIGH (ref 0.00–0.50)

## 2019-03-22 MED ORDER — ONDANSETRON HCL 4 MG/2ML IJ SOLN: 4.0000 mg | Freq: Four times a day (QID) | INTRAMUSCULAR | Status: DC | PRN

## 2019-03-22 MED ORDER — NITROGLYCERIN 0.4 MG SL SUBL: 0.4000 mg | SUBLINGUAL_TABLET | SUBLINGUAL | Status: DC | PRN

## 2019-03-22 MED ORDER — WARFARIN - PHYSICIAN DOSING INPATIENT
Freq: Every day | Status: DC
  Administered 2019-03-22: 18:00:00

## 2019-03-22 MED ORDER — SODIUM CHLORIDE 0.9% FLUSH
3.0000 mL | Freq: Once | INTRAVENOUS | Status: AC
  Administered 2019-03-22: 3 mL via INTRAVENOUS

## 2019-03-22 MED ORDER — UMECLIDINIUM BROMIDE 62.5 MCG/INH IN AEPB
1.0000 | INHALATION_SPRAY | Freq: Every day | RESPIRATORY_TRACT | Status: DC
  Administered 2019-03-23 – 2019-03-24 (×2): 1 via RESPIRATORY_TRACT
  Filled 2019-03-22: qty 7

## 2019-03-22 MED ORDER — MONTELUKAST SODIUM 10 MG PO TABS
10.0000 mg | ORAL_TABLET | Freq: Every day | ORAL | Status: DC
  Administered 2019-03-22 – 2019-03-23 (×2): 10 mg via ORAL
  Filled 2019-03-22 (×2): qty 1

## 2019-03-22 MED ORDER — METOPROLOL SUCCINATE ER 50 MG PO TB24
50.0000 mg | ORAL_TABLET | Freq: Every day | ORAL | Status: DC
  Administered 2019-03-22 – 2019-03-23 (×2): 50 mg via ORAL
  Filled 2019-03-22 (×2): qty 1

## 2019-03-22 MED ORDER — IOHEXOL 350 MG/ML SOLN
75.0000 mL | Freq: Once | INTRAVENOUS | Status: AC | PRN
  Administered 2019-03-22: 75 mL via INTRAVENOUS

## 2019-03-22 MED ORDER — ACETAMINOPHEN 325 MG PO TABS
650.0000 mg | ORAL_TABLET | Freq: Four times a day (QID) | ORAL | Status: DC | PRN
  Filled 2019-03-22: qty 2

## 2019-03-22 MED ORDER — METOPROLOL SUCCINATE ER 100 MG PO TB24
100.0000 mg | ORAL_TABLET | Freq: Every day | ORAL | Status: DC
  Administered 2019-03-23 – 2019-03-24 (×2): 100 mg via ORAL
  Filled 2019-03-22 (×2): qty 1

## 2019-03-22 MED ORDER — UMECLIDINIUM-VILANTEROL 62.5-25 MCG/INH IN AEPB
1.0000 | INHALATION_SPRAY | Freq: Every day | RESPIRATORY_TRACT | Status: DC
  Filled 2019-03-22 (×2): qty 14

## 2019-03-22 MED ORDER — POTASSIUM CHLORIDE CRYS ER 10 MEQ PO TBCR
10.0000 meq | EXTENDED_RELEASE_TABLET | Freq: Two times a day (BID) | ORAL | Status: DC
  Administered 2019-03-22 – 2019-03-24 (×4): 10 meq via ORAL
  Filled 2019-03-22 (×4): qty 1

## 2019-03-22 MED ORDER — INSULIN ASPART 100 UNIT/ML ~~LOC~~ SOLN
0.0000 [IU] | Freq: Three times a day (TID) | SUBCUTANEOUS | Status: DC
  Administered 2019-03-23 – 2019-03-24 (×2): 1 [IU] via SUBCUTANEOUS

## 2019-03-22 MED ORDER — METOPROLOL SUCCINATE ER 50 MG PO TB24: 50.0000 mg | ORAL_TABLET | ORAL | Status: DC

## 2019-03-22 MED ORDER — FUROSEMIDE 20 MG PO TABS
20.0000 mg | ORAL_TABLET | Freq: Two times a day (BID) | ORAL | Status: DC
  Administered 2019-03-22 – 2019-03-24 (×4): 20 mg via ORAL
  Filled 2019-03-22 (×4): qty 1

## 2019-03-22 MED ORDER — WARFARIN SODIUM 7.5 MG PO TABS: 7.5000 mg | ORAL_TABLET | Freq: Every day | ORAL | Status: DC

## 2019-03-22 MED ORDER — ONDANSETRON HCL 4 MG PO TABS: 4.0000 mg | ORAL_TABLET | Freq: Four times a day (QID) | ORAL | Status: DC | PRN

## 2019-03-22 MED ORDER — BUSPIRONE HCL 5 MG PO TABS
10.0000 mg | ORAL_TABLET | Freq: Two times a day (BID) | ORAL | Status: DC
  Administered 2019-03-22 – 2019-03-24 (×4): 10 mg via ORAL
  Filled 2019-03-22 (×4): qty 2

## 2019-03-22 MED ORDER — AMLODIPINE BESYLATE 2.5 MG PO TABS
2.5000 mg | ORAL_TABLET | Freq: Every day | ORAL | Status: DC
  Administered 2019-03-22 – 2019-03-23 (×2): 2.5 mg via ORAL
  Filled 2019-03-22 (×2): qty 1

## 2019-03-22 MED ORDER — WARFARIN SODIUM 5 MG PO TABS
5.0000 mg | ORAL_TABLET | Freq: Every day | ORAL | Status: DC
  Administered 2019-03-22: 5 mg via ORAL
  Filled 2019-03-22: qty 1

## 2019-03-22 MED ORDER — PANTOPRAZOLE SODIUM 20 MG PO TBEC
20.0000 mg | DELAYED_RELEASE_TABLET | Freq: Every day | ORAL | Status: DC
  Administered 2019-03-23 – 2019-03-24 (×2): 20 mg via ORAL
  Filled 2019-03-22 (×3): qty 1

## 2019-03-22 MED ORDER — COLCHICINE 0.6 MG PO TABS
0.6000 mg | ORAL_TABLET | Freq: Every day | ORAL | Status: DC
  Administered 2019-03-23 – 2019-03-24 (×2): 0.6 mg via ORAL
  Filled 2019-03-22 (×2): qty 1

## 2019-03-22 NOTE — ED Triage Notes (Signed)
Patient from [facility] with [facility] for chest tightness that started at 0800 this morning, rated intensity 8/10. History of afib and ablation. Given 4x NTG and 324mg  aspirin PTA. Patient reports chest tightness 4/10 right now, denies pain. Patient also reports shortness of breath but states she regularly feels short of breath and that symptom is unchanged from baseline. Patient alert and oriented at this time.

## 2019-03-22 NOTE — H&P (Signed)
History and Physical    Lauren RushingCora Honor YNW:[id number] DOB: [birth year] DOA: 03/22/2019  PCP: System, Pcp Not In   Patient coming from: SNF   Chief Complaint: Palpitations and chest pain.   HPI: Lauren Holland is a 78 y.o. female with medical history significant of recent hospitalization 06/23 to 06/27, due to right foot gout flare, and Klebsiella urinary tract infection. She also has chronic diastolic heart failure, chronic atrial fibrillation, type 2 diabetes mellitus, hypertension, chronic kidney disease stage III, COPD, and GERD.   She has been doing daily physical therapy at the skilled nursing facility, and she has noticed increased fatigue with each time of therapy session.  Today she was sitting and after 60 minutes of receiving her oral medications, she felt lightheaded, positive palpitations and chest pressure.  The palpitations were moderate to severe intensity, no improving or worsening factors, no associated dyspnea or diaphoresis, transitory in nature.  Her vitals were checked and reported to be normal.  She talked to her son who was concerned about his mother symptoms and requested her to be transported to the emergency room.  ED Course: Patient still felt lightheaded, her vitals have been normal, troponins negative, d-dimer was elevated.  He was referred for further evaluation.  Review of Systems:  1. General: No fevers, no chills, no weight gain or weight loss 2. ENT: No runny nose or sore throat, no hearing disturbances 3. Pulmonary: No dyspnea, cough, wheezing, or hemoptysis 4. Cardiovascular: No angina, claudication, lower extremity edema, pnd or orthopnea/ positive chest pressure.  5. Gastrointestinal: No nausea or vomiting, no diarrhea or constipation 6. Hematology: No easy bruisability or frequent infections 7. Urology: No dysuria, hematuria or increased urinary frequency 8. Dermatology: No rashes. 9. Neurology: No seizures or paresthesias 10. Musculoskeletal: No joint pain  or deformities  Past Medical History:  Diagnosis Date   Atrial fibrillation, chronic    Chronic diastolic (congestive) heart failure (HCC)    CKD (chronic kidney disease), stage III (HCC)    COPD (chronic obstructive pulmonary disease) (HCC)    Depression    GERD (gastroesophageal reflux disease)    Gout    HLD (hyperlipidemia)    HTN (hypertension)    LBBB (left bundle branch block)    OSA on CPAP    Pacemaker    Type II diabetes mellitus with renal manifestations (HCC)     Past Surgical History:  Procedure Laterality Date   PACEMAKER PLACEMENT       reports that she has never smoked. She has never used smokeless tobacco. She reports previous alcohol use. She reports that she does not use drugs.  Allergies  Allergen Reactions   Codeine Palpitations   Sulfamethoxazole-Trimethoprim Anaphylaxis   Metoclopramide Other (See Comments)    Tremors    Fenofibrate Other (See Comments)    Patient refuses to take it    Family History  Problem Relation Age of Onset   Diabetes Mellitus II Mother    Hypertension Mother    Hypertension Father    Diabetes Mellitus II Sister    Hypertension Sister      Prior to Admission medications   Medication Sig Start Date End Date Taking? Authorizing Provider  acetaminophen (TYLENOL) 325 MG tablet Take 2 tablets (650 mg total) by mouth every 6 (six) hours as needed for mild pain (or Fever >/= 101). 03/08/19   Zannie CoveJoseph, Preetha, MD  amLODipine (NORVASC) 2.5 MG tablet Take 2.5 mg by mouth daily.    [provider]  busPIRone (BUSPAR)  10 MG tablet Take 10 mg by mouth 2 (two) times a day.    [provider]  colchicine 0.6 MG tablet Take 1 tablet (0.6 mg total) by mouth daily. 03/03/19   Renne CriglerGeiple, Joshua, PA-C  enoxaparin (LOVENOX) 80 MG/0.8ML injection Inject 0.8 mLs (80 mg total) into the skin 2 (two) times daily for 2 days. 03/08/19 03/10/19  Zannie CoveJoseph, Preetha, MD  Fluticasone-Umeclidin-Vilant 100-62.5-25  MCG/INH AEPB Inhale 1 puff into the lungs daily.    [provider]  furosemide (LASIX) 20 MG tablet Take 20 mg by mouth 2 (two) times a day. 03/16/18   [provider]  Insulin Detemir (LEVEMIR FLEXTOUCH) 100 UNIT/ML Pen Inject 30 Units into the skin at bedtime. 03/08/19   Zannie CoveJoseph, Preetha, MD  lansoprazole (PREVACID) 30 MG capsule Take 30 mg by mouth daily.    [provider]  metFORMIN (GLUCOPHAGE) 500 MG tablet Take 500 mg by mouth daily.    [provider]  metoprolol succinate (TOPROL-XL) 50 MG 24 hr tablet Take 50-100 mg by mouth See admin instructions. 100mg  in the morning and 50mg  at bedtime 01/25/16   [provider]  montelukast (SINGULAIR) 10 MG tablet Take 10 mg by mouth at bedtime.    [provider]  nitroGLYCERIN (NITROSTAT) 0.4 MG SL tablet Place 1 tablet under the tongue every 5 (five) hours as needed for chest pain. 11/29/18   [provider]  potassium chloride (K-DUR) 10 MEQ tablet Take 10 mEq by mouth 2 (two) times a day. 10/04/15   [provider]  warfarin (COUMADIN) 5 MG tablet Take 1.5 tablets (7.5 mg total) by mouth daily. Take 7.5mg  for 2days then resume 5mg  daily for INR 2-3 03/08/19   Zannie CoveJoseph, Preetha, MD    Physical Exam: Vitals:   03/22/19 1229 03/22/19 1400 03/22/19 1445 03/22/19 1500  BP:  (!) 162/83  (!) 158/76  Pulse:  89  60  Resp:  19    Temp: 98 F (36.7 C)     TempSrc: Oral     SpO2:  97% 97%   Weight:      Height:        Vitals:   03/22/19 1229 03/22/19 1400 03/22/19 1445 03/22/19 1500  BP:  (!) 162/83  (!) 158/76  Pulse:  89  60  Resp:  19    Temp: 98 F (36.7 C)     TempSrc: Oral     SpO2:  97% 97%   Weight:      Height:       General: deconditioned  Neurology: Awake and alert, non focal Head and Neck. Head normocephalic. Neck supple with no adenopathy or thyromegaly.   E ENT: mild pallor, no icterus, oral mucosa moist Cardiovascular: No JVD. S1-S2 present, rhythmic, no  gallops, rubs, or murmurs. No lower extremity edema. Pulmonary: positive breath sounds bilaterally, adequate air movement, no wheezing, rhonchi or rales. Gastrointestinal. Abdomen with no organomegaly, non tender, no rebound or guarding Skin. No rashes Musculoskeletal: no joint deformities    Labs on Admission: I have personally reviewed following labs and imaging studies  CBC: Recent Labs  Lab 03/22/19 1208  WBC 6.5  HGB 12.5  HCT 39.2  MCV 101.3*  PLT 183   Basic Metabolic Panel: Recent Labs  Lab 03/22/19 1208  NA 141  K 3.7  CL 105  CO2 25  GLUCOSE 99  BUN 15  CREATININE 0.84  CALCIUM 9.4   GFR: Estimated Creatinine Clearance: 52.3 mL/min (by C-G formula  based on SCr of 0.84 mg/dL). Liver Function Tests: No results for input(s): AST, ALT, ALKPHOS, BILITOT, PROT, ALBUMIN in the last 168 hours. No results for input(s): LIPASE, AMYLASE in the last 168 hours. No results for input(s): AMMONIA in the last 168 hours. Coagulation Profile: Recent Labs  Lab 03/22/19 1208  INR 2.1*   Cardiac Enzymes: No results for input(s): CKTOTAL, CKMB, CKMBINDEX, TROPONINI in the last 168 hours. BNP (last 3 results) No results for input(s): PROBNP in the last 8760 hours. HbA1C: No results for input(s): HGBA1C in the last 72 hours. CBG: No results for input(s): GLUCAP in the last 168 hours. Lipid Profile: No results for input(s): CHOL, HDL, LDLCALC, TRIG, CHOLHDL, LDLDIRECT in the last 72 hours. Thyroid Function Tests: No results for input(s): TSH, T4TOTAL, FREET4, T3FREE, THYROIDAB in the last 72 hours. Anemia Panel: No results for input(s): VITAMINB12, FOLATE, FERRITIN, TIBC, IRON, RETICCTPCT in the last 72 hours. Urine analysis:    Component Value Date/Time   COLORURINE YELLOW 03/05/2019 0306   APPEARANCEUR TURBID (A) 03/05/2019 0306   LABSPEC 1.030 03/05/2019 0306   PHURINE 5.0 03/05/2019 0306   GLUCOSEU 50 (A) 03/05/2019 0306   HGBUR SMALL (A) 03/05/2019 0306    BILIRUBINUR NEGATIVE 03/05/2019 0306   KETONESUR NEGATIVE 03/05/2019 0306   PROTEINUR 100 (A) 03/05/2019 0306   NITRITE POSITIVE (A) 03/05/2019 0306   LEUKOCYTESUR LARGE (A) 03/05/2019 0306    Radiological Exams on Admission: Dg Chest 2 View  Result Date: 03/22/2019 CLINICAL DATA:  Chest tightness, shortness of breath. EXAM: CHEST - 2 VIEW COMPARISON:  None. FINDINGS: Cardiac pacemaker in in place. Cardiomediastinal silhouette is normal. Mediastinal contours appear intact. Elevation of the right hemidiaphragm. Bibasilar atelectasis versus peribronchial airspace consolidation. Low lung volumes. Osseous structures are without acute abnormality. Soft tissues are grossly normal. IMPRESSION: 1. Low lung volumes. 2. Bibasilar atelectasis versus peribronchial airspace consolidation. Electronically Signed   By: Ted Mcalpineobrinka  Dimitrova M.D.   On: 03/22/2019 12:57    EKG: Independently reviewed.  66 bpm, right axis deviation, sinus with a right bundle branch block, frequent ST segment changes, negative T waves V1 to V3.  Assessment/Plan Principal Problem:   Chest pain Active Problems:   Chronic diastolic (congestive) heart failure (HCC)   Atrial fibrillation, chronic   CKD (chronic kidney disease), stage III (HCC)   COPD (chronic obstructive pulmonary disease) (HCC)   Depression   GERD (gastroesophageal reflux disease)   Gout   HLD (hyperlipidemia)   HTN (hypertension)   OSA on CPAP   Type II diabetes mellitus with renal manifestations (HCC)   78 year old female who has been recently transferred to skilled nursing facility for rehabilitation after being hospitalized for acute gout flare and Klebsiella urinary tract infection.  Apparently she has been developing worsening fatigue each time she does physical therapy.  Today while being at rest developed palpitations and chest pressure about 60 minutes after taking oral medications.  On her physical examination her blood pressure is 168/63, heart rate  63, respiratory rate 17, oxygen saturation 99% on room air.  Her lungs are clear to auscultation, heart S1-S2 present and rhythmic, abdomen is soft, nontender, no lower extremity edema.  Sodium 141, potassium 3.7, chloride 105, bicarb 25, glucose 99, BUN 15, creatinine 0.84, BNP 90, white count 6.5, hemoglobin 12.5, hematocrit 39.2, platelets 183, d-dimer 1.47, INR 2.1 SARS COVID-19 is pending.  Her chest radiograph has hyperinflation, increased lung markings bilaterally, pacer in place.  Atrial and ventricular lead.   Patient will be  admitted to the hospital with the working diagnosis of atypical chest pain, to rule out uncontrolled arrhythmia.  1.  Palpitations with atypical chest pain.  Patient will be admitted to the telemetry unit, continue to trend cardiac enzymes, follow-up EKG in the morning.  Further work-up with CT angiography, even though she is fully anticoagulated with warfarin, in the setting of recent hospitalization and elevated d-dimer, will order imaging.  2.  Hypertension.  Continue blood pressure control with amlodipine and metoprolol  3.  Paroxysmal atrial fibrillation.  Continue rate control with metoprolol, 100 mg in the morning and 50 mg in the afternoon.  Anticoagulation with warfarin.  4.  Type 2 diabetes mellitus.  Will hold metformin, continue lower dose of Levemir down to 15 units daily, insulin sliding scale for glucose coverage and monitoring.  5.  Chronic diastolic heart failure.  No signs of acute exacerbation, continue furosemide 20 mg daily, blood pressure control with amlodipine and metoprolol.  6.  COPD.  No signs of acute exacerbation, continue bronchodilator therapy.  DVT prophylaxis:  Warfarin  Code Status: dnr   Family Communication: no family at the bedside   Disposition Plan: telemetry.  Consults called: none   Admission status: Observation.     Neah Sporrer Gerome Apley MD [facility]   03/22/2019, 4:15 PM

## 2019-03-22 NOTE — ED Provider Notes (Signed)
[facility] EMERGENCY DEPARTMENT Provider Note   CSN: [id number] Arrival date & time: 03/22/19  1126    History   Chief Complaint Chief Complaint  Patient presents with  . Chest Pain    HPI Pennie RushingCora Hammersmith is a 78 y.o. female with PMHx a fib, CHF, CKD stage III, COPD, GERD, HTN, HLD, OSA on CPAP, Type II DM, hx pacemaker who presents to the ED today via EMS complaining of sudden onset, constant, 8/10, left sided chest tightness radiating down left arm that began this morning around 8 AM. Pt reports she had been awake for a couple of hours prior to her pain beginning. She was sitting down when the pain started. Pt also complains of mild nausea. Per triage she endorses shortness of breath; states that this is not new for her. 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 [place] and is currently living [facility]. Pt does not have a PCP here yet; she had an appointment scheduled last week but for whatever reason the rehab center would not let her go to get established. Denies fever, chills, cough, abdominal pain, vomiting, or any other associated symptoms.        Past Medical History:  Diagnosis Date  . Atrial fibrillation, chronic   . Chronic diastolic (congestive) heart failure (HCC)   . CKD (chronic kidney disease), stage III (HCC)   . COPD (chronic obstructive pulmonary disease) (HCC)   . Depression   . GERD (gastroesophageal reflux disease)   . Gout   . HLD (hyperlipidemia)   . HTN (hypertension)   . LBBB (left bundle branch block)   . OSA on CPAP   . Pacemaker   . Type II diabetes mellitus with renal manifestations [facility](HCC)     Patient Active Problem List   Diagnosis Date Noted  . Chest pain 03/22/2019  . Fall 03/05/2019  . UTI (urinary tract infection) 03/05/2019  . Generalized weakness 03/05/2019  . Chronic diastolic (congestive) heart failure (HCC)   . Atrial fibrillation, chronic   . CKD  (chronic kidney disease), stage III (HCC)   . COPD (chronic obstructive pulmonary disease) (HCC)   . Depression   . GERD (gastroesophageal reflux disease)   . Gout   . HLD (hyperlipidemia)   . HTN (hypertension)   . OSA on CPAP   . Type II diabetes mellitus with renal manifestations (HCC)   . Contusion of flank     Past Surgical History:  Procedure Laterality Date  . PACEMAKER PLACEMENT       OB History   No obstetric history on file.      Home Medications    Prior to Admission medications   Medication Sig Start Date End Date Taking? Authorizing Provider  acetaminophen (TYLENOL) 325 MG tablet Take 2 tablets (650 mg total) by mouth every 6 (six) hours as needed for mild pain (or Fever >/= 101). Patient taking differently: Take 650 mg by mouth every 6 (six) hours as needed for mild pain or headache (or Fever >/= 101).  03/08/19  Yes Zannie CoveJoseph, Preetha, MD  amLODipine (NORVASC) 2.5 MG tablet Take 2.5 mg by mouth daily.   Yes [provider]  busPIRone (BUSPAR) 10 MG tablet Take 10 mg by mouth 2 (two) times a day.   Yes [provider]  colchicine 0.6 MG tablet Take 1 tablet (0.6 mg total) by mouth daily. Patient taking differently: Take 0.6 mg by  mouth every 12 (twelve) hours.  03/03/19  Yes Carlisle Cater, PA-C  Fluticasone-Umeclidin-Vilant 100-62.5-25 MCG/INH AEPB Inhale 1 puff into the lungs daily.   Yes [provider]  furosemide (LASIX) 20 MG tablet Take 20 mg by mouth daily.  03/16/18  Yes [provider]  Insulin Detemir (LEVEMIR FLEXTOUCH) 100 UNIT/ML Pen Inject 30 Units into the skin at bedtime. 03/08/19  Yes Domenic Polite, MD  lansoprazole (PREVACID) 30 MG capsule Take 30 mg by mouth daily.   Yes [provider]  metFORMIN (GLUCOPHAGE) 500 MG tablet Take 500 mg by mouth daily.   Yes [provider]  metoprolol succinate (TOPROL-XL) 50 MG 24 hr tablet Take 50-100 mg by mouth See admin instructions. Take 100 mg by mouth in  the morning and 50 mg at bedtime 01/25/16  Yes [provider]  montelukast (SINGULAIR) 10 MG tablet Take 10 mg by mouth at bedtime.   Yes [provider]  nitroGLYCERIN (NITROSTAT) 0.4 MG SL tablet Place 1 tablet under the tongue every 5 (five) minutes x 3 doses as needed for chest pain.  11/29/18  Yes [provider]  potassium chloride (K-DUR) 10 MEQ tablet Take 10 mEq by mouth 2 (two) times a day. 10/04/15  Yes [provider]  PRESCRIPTION MEDICATION See admin instructions. BiPAP: "Every evening and night shift"   Yes [provider]  enoxaparin (LOVENOX) 80 MG/0.8ML injection Inject 0.8 mLs (80 mg total) into the skin 2 (two) times daily for 2 days. Patient not taking: Reported on 03/22/2019 03/08/19 03/22/19  Domenic Polite, MD  warfarin (COUMADIN) 5 MG tablet Take 1.5 tablets (7.5 mg total) by mouth daily. Take 7.5mg  for 2days then resume 5mg  daily for INR 2-3 Patient taking differently: Take 7.5 mg by mouth See admin instructions. Take 7.5 mg by mouth once a day at 5 PM and HOLD from 03/21/2019 through 03/22/2019 03/08/19   Domenic Polite, MD    Family History Family History  Problem Relation Age of Onset  . Diabetes Mellitus II Mother   . Hypertension Mother   . Hypertension Father   . Diabetes Mellitus II Sister   . Hypertension Sister     Social History Social History   Tobacco Use  . Smoking status: Never Smoker  . Smokeless tobacco: Never Used  Substance Use Topics  . Alcohol use: Not Currently  . Drug use: Never     Allergies   Codeine, Sulfa antibiotics, Sulfamethoxazole-trimethoprim, Metoclopramide, and Fenofibrate   Review of Systems Review of Systems  Constitutional: Negative for chills and fever.  HENT: Negative for congestion.   Eyes: Negative for visual disturbance.  Respiratory: Positive for chest tightness and shortness of breath (baseline). Negative for cough.   Cardiovascular: Positive for chest pain.  Negative for palpitations and leg swelling.  Gastrointestinal: Positive for nausea. Negative for abdominal pain, constipation, diarrhea and vomiting.  Genitourinary: Negative for difficulty urinating.  Musculoskeletal: Negative for joint swelling and myalgias.  Skin: Negative for rash.  Neurological: Negative for syncope and headaches.     Physical Exam Updated Vital Signs BP (!) 162/83   Pulse 89   Temp 98 F (36.7 C) (Oral)   Resp 19   Ht 5' (1.524 m)   Wt 77.1 kg   SpO2 97%   BMI 33.20 kg/m   Physical Exam Vitals signs and nursing note reviewed.  Constitutional:      Appearance: She is not ill-appearing or diaphoretic.  HENT:     Head: Normocephalic and atraumatic.  Eyes:     Conjunctiva/sclera: Conjunctivae normal.  Neck:     Musculoskeletal: Neck supple.  Cardiovascular:     Rate and Rhythm: Normal rate and regular rhythm.     Pulses:          Radial pulses are 2+ on the right side and 2+ on the left side.       Dorsalis pedis pulses are 2+ on the right side and 2+ on the left side.     Heart sounds: Normal heart sounds.  Pulmonary:     Effort: Pulmonary effort is normal.     Breath sounds: Normal breath sounds. No decreased breath sounds, wheezing, rhonchi or rales.  Chest:     Chest wall: No tenderness.  Abdominal:     Palpations: Abdomen is soft. There is no mass.     Tenderness: There is no abdominal tenderness. There is no guarding or rebound.  Musculoskeletal:     Right lower leg: No edema.     Left lower leg: No edema.  Skin:    General: Skin is warm and dry.  Neurological:     Mental Status: She is alert.      ED Treatments / Results  Labs (all labs ordered are listed, but only abnormal results are displayed) Labs Reviewed  CBC - Abnormal; Notable for the following components:      Result Value   MCV 101.3 (*)    All other components within normal limits  PROTIME-INR - Abnormal; Notable for the following components:   Prothrombin Time  23.6 (*)    INR 2.1 (*)    All other components within normal limits  D-DIMER, QUANTITATIVE (NOT AT [facility]) - Abnormal; Notable for the following components:   D-Dimer, Quant 1.47 (*)    All other components within normal limits  SARS CORONAVIRUS 2 (HOSPITAL ORDER, PERFORMED IN [facility])  MRSA PCR SCREENING  BASIC METABOLIC PANEL  BRAIN NATRIURETIC PEPTIDE  GLUCOSE, CAPILLARY  BASIC METABOLIC PANEL  CBC  PROTIME-INR  TROPONIN I (HIGH SENSITIVITY)  TROPONIN I (HIGH SENSITIVITY)    EKG EKG Interpretation  Date/Time:  Saturday March 22 2019 11:34:53 EDT Ventricular Rate:  66 PR Interval:    QRS Duration: 188 QT Interval:  486 QTC Calculation: 510 R Axis:   149 Text Interpretation:  Sinus rhythm Right bundle branch block st depression v1 and v2 with nsst inferior leads  No significant change since last tracing of March 04 2019 Confirmed by Margarita Grizzleay, Danielle [phone]([id number]) on 03/22/2019 1:33:13 PM   Radiology Dg Chest 2 View  Result Date: 03/22/2019 CLINICAL DATA:  Chest tightness, shortness of breath. EXAM: CHEST - 2 VIEW COMPARISON:  None. FINDINGS: Cardiac pacemaker in in place. Cardiomediastinal silhouette is normal. Mediastinal contours appear intact. Elevation of the right hemidiaphragm. Bibasilar atelectasis versus peribronchial airspace consolidation. Low lung volumes. Osseous structures are without acute abnormality. Soft tissues are grossly normal. IMPRESSION: 1. Low lung volumes. 2. Bibasilar atelectasis versus peribronchial airspace consolidation. Electronically Signed   By: Ted Mcalpineobrinka  Dimitrova M.D.   On: 03/22/2019 12:57    Procedures Procedures (including critical care time)  Medications Ordered in ED Medications  acetaminophen (TYLENOL) tablet 650 mg (has no administration in time range)  colchicine tablet 0.6 mg (has no administration in time range)  amLODipine (NORVASC) tablet 2.5 mg (2.5 mg Oral Given 03/22/19 1817)  furosemide (LASIX) tablet 20 mg (20 mg Oral  Given 03/22/19 1814)  nitroGLYCERIN (NITROSTAT) SL tablet 0.4 mg (has no administration in  time range)  busPIRone (BUSPAR) tablet 10 mg (has no administration in time range)  pantoprazole (PROTONIX) EC tablet 20 mg (has no administration in time range)  potassium chloride (K-DUR) CR tablet 10 mEq (has no administration in time range)  umeclidinium-vilanterol (ANORO ELLIPTA) 62.5-25 MCG/INH 1 puff (1 puff Inhalation Not Given 03/22/19 1725)  montelukast (SINGULAIR) tablet 10 mg (has no administration in time range)  ondansetron (ZOFRAN) tablet 4 mg (has no administration in time range)    Or  ondansetron (ZOFRAN) injection 4 mg (has no administration in time range)  umeclidinium bromide (INCRUSE ELLIPTA) 62.5 MCG/INH 1 puff (1 puff Inhalation Not Given 03/22/19 1725)  metoprolol succinate (TOPROL-XL) 24 hr tablet 100 mg (has no administration in time range)    And  metoprolol succinate (TOPROL-XL) 24 hr tablet 50 mg (has no administration in time range)  Warfarin - Physician Dosing Inpatient ( Does not apply Given 03/22/19 1817)  warfarin (COUMADIN) tablet 5 mg (5 mg Oral Given 03/22/19 1814)  insulin aspart (novoLOG) injection 0-9 Units (has no administration in time range)  sodium chloride flush (NS) 0.9 % injection 3 mL (3 mLs Intravenous Given 03/22/19 1818)     Initial Impression / Assessment and Plan / ED Course  I have reviewed the triage vital signs and the nursing notes.  Pertinent labs & imaging results that were available during my care of the patient were reviewed by me and considered in my medical decision making (see chart for details).  [age] year old female presenting to the ED today with chest tightness, nausea, and unchanged SOB. Hx of MI in the past. Pt has pacemaker in place; she has hx of diabetes, CHF, and CKD not on dialysis. [place] - drove by car here 4 weeks ago and currently living at a rehab facility. Pain started this AM  around 8; received 324 mg ASA and 4 rounds of NTG in the EMS with mild relief. Pt has allergy to morphine; held at this point. Concern for ACS vs PE at this time; pt is currently on Coumadin; reports her most recent INR was 3.1. Will check d dimer regardless given recent prolonged travel; suspect to be elevated with CKD - will age adjust.   ST changes on EKG. Initial trop 9; repeat 8. BNP within normal limits. CBC and BMP unremarkable. CXR clear.Pt heart score 7; given this and hx of MI in the past do not feel comfortable discharging patient today. Will consult hospitalist for admission at this time for cardiac rule out.   Clinical Course as of Mar 21 2016  Sat Mar 22, 2019  1303 Elevated; with age adjusted cut off VTE unlikely  D-Dimer, Quant(!): 1.47 [MV]  1532 Discussed case with Dr. Ella JubileeArrien who agrees to accept patient for admission   [MV]    Clinical Course User Index [MV] Tanda RockersVenter, Adelynne Joerger, PA-C         Final Clinical Impressions(s) / ED Diagnoses   Final diagnoses:  Nonspecific chest pain    ED Discharge Orders    None       Tanda RockersVenter, Ladarion Munyon, PA-C 03/22/19 2017    Margarita Grizzleay, Danielle, MD 03/28/19 1349

## 2019-03-22 NOTE — ED Notes (Signed)
Patient transported to x-ray. ?

## 2019-03-22 NOTE — Progress Notes (Signed)
I spoke with the patient's family about patient's condition, plan of care and all questions were addressed. 

## 2019-03-22 NOTE — ED Notes (Signed)
Pt son Coralyn Mark [phone]

## 2019-03-22 NOTE — ED Notes (Signed)
ED TO INPATIENT HANDOFF REPORT  ED Nurse Name and Phone #:  Patty [phone]  S Name/Age/Gender Lauren Holland 78 y.o. female Room/Bed: 046C/046C  Code Status   Code Status: Prior  Home/SNF/Other Nursing Home Patient oriented to: self, place, time and situation Is this baseline? Yes   Triage Complete: Triage complete  Chief Complaint chest tightness  [facility] Note [facility] for chest tightness that started at 0800 this morning, rated intensity 8/10. History of afib and ablation. Given 4x NTG and 324mg  aspirin PTA. Patient reports chest tightness 4/10 right now, denies pain. Patient also reports shortness of breath but states she regularly feels short of breath and that symptom is unchanged from baseline. Patient alert and oriented at this time.   Allergies Allergies  Allergen Reactions  . Codeine Palpitations  . Sulfamethoxazole-Trimethoprim Anaphylaxis  . Metoclopramide Other (See Comments)    Tremors   . Fenofibrate Other (See Comments)    Patient refuses to take it    Level of Care/Admitting Diagnosis ED Disposition    ED Disposition Condition Comment   Admit  [facility] [[id number]]  Level of Care: Telemetry Cardiac [103]  I expect the patient will be discharged within 24 hours: Yes  LOW acuity---Tx typically complete <24 hrs---ACUTE conditions typically can be evaluated <24 hours---LABS likely to return to acceptable levels <24 hours---IS near functional baseline---EXPECTED to return to current living arrangement---NOT newly hypoxic: Meets criteria for 5C-Observation unit  Covid Evaluation: N/A  Diagnosis: Chest pain [[id number]]  Admitting Physician: Coralie KeensRRIEN, MAURICIO DANIEL [[id number]]  Attending Physician: Coralie KeensARRIEN, MAURICIO DANIEL [[id number]]  PT Class (Do Not Modify): Observation [104]  PT Acc Code (Do Not Modify): Observation [10022]       B Medical/Surgery History Past Medical History:  Diagnosis  Date  . Atrial fibrillation, chronic   . Chronic diastolic (congestive) heart failure (HCC)   . CKD (chronic kidney disease), stage III (HCC)   . COPD (chronic obstructive pulmonary disease) (HCC)   . Depression   . GERD (gastroesophageal reflux disease)   . Gout   . HLD (hyperlipidemia)   . HTN (hypertension)   . LBBB (left bundle branch block)   . OSA on CPAP   . Pacemaker   . Type II diabetes mellitus with renal manifestations [facility](HCC)    Past Surgical History:  Procedure Laterality Date  . PACEMAKER PLACEMENT       A IV Location/Drains/Wounds Patient Lines/Drains/Airways Status   Active Line/Drains/Airways    Name:   Placement date:   Placement time:   Site:   Days:   Peripheral IV 03/05/19 Left Antecubital   03/05/19    0034    Antecubital   17   Peripheral IV 03/22/19 Anterior;Left Forearm   03/22/19    -    Forearm   less than 1   External Urinary Catheter   03/05/19    1000    -   17          Intake/Output Last 24 hours No intake or output data in the 24 hours ending 03/22/19 1601  Labs/Imaging Results for orders placed or performed during the hospital encounter of 03/22/19 (from the past 48 hour(s))  Basic metabolic panel     Status: None   Collection Time: 03/22/19 12:08 PM  Result Value Ref Range   Sodium 141 135 - 145 mmol/L   Potassium 3.7 3.5 - 5.1 mmol/L   Chloride 105 98 - 111 mmol/L  CO2 25 22 - 32 mmol/L   Glucose, Bld 99 70 - 99 mg/dL   BUN 15 8 - 23 mg/dL   Creatinine, Ser 0.84 0.44 - 1.00 mg/dL   Calcium 9.4 8.9 - 10.3 mg/dL   GFR calc non Af Amer >60 >60 mL/min   GFR calc Af Amer >60 >60 mL/min   Anion gap 11 5 - 15    Comment: Performed at [facility] [address] [place], [place] [address]  CBC     Status: Abnormal   Collection Time: 03/22/19 12:08 PM  Result Value Ref Range   WBC 6.5 4.0 - 10.5 K/uL   RBC 3.87 3.87 - 5.11 MIL/uL   Hemoglobin 12.5 12.0 - 15.0 g/dL   HCT 39.2 36.0 - 46.0 %   MCV 101.3 (H) 80.0 - 100.0 fL    MCH 32.3 26.0 - 34.0 pg   MCHC 31.9 30.0 - 36.0 g/dL   RDW 15.3 11.5 - 15.5 %   Platelets 183 150 - 400 K/uL   nRBC 0.0 0.0 - 0.2 %    Comment: Performed at [facility], [place] [address] [place], [place] [address]  Troponin I (High Sensitivity)     Status: None   Collection Time: 03/22/19 12:08 PM  Result Value Ref Range   Troponin I (High Sensitivity) 9 <18 ng/L    Comment: (NOTE) Elevated high sensitivity troponin I (hsTnI) values and significant  changes across serial measurements may suggest ACS but many other  chronic and acute conditions are known to elevate hsTnI results.  Refer to the "Links" section for chest pain algorithms and additional  guidance. Performed at [facility], [place] [address] [place], [place] [address]   Protime-INR     Status: Abnormal   Collection Time: 03/22/19 12:08 PM  Result Value Ref Range   Prothrombin Time 23.6 (H) 11.4 - 15.2 seconds   INR 2.1 (H) 0.8 - 1.2    Comment: (NOTE) INR goal varies based on device and disease states. Performed at [facility], [place] [address], [place], [place] [address]   Brain natriuretic peptide     Status: None   Collection Time: 03/22/19 12:08 PM  Result Value Ref Range   B Natriuretic Peptide 90.1 0.0 - 100.0 pg/mL    Comment: Performed at [facility] [address] [place],  [address]  D-dimer, quantitative (not at [facility])     Status: Abnormal   Collection Time: 03/22/19 12:08 PM  Result Value Ref Range   D-Dimer, Quant 1.47 (H) 0.00 - 0.50 ug/mL-FEU    Comment: (NOTE) At the manufacturer cut-off of 0.50 ug/mL FEU, this assay has been documented to exclude PE with a sensitivity and negative predictive value of 97 to 99%.  At this time, this assay has not been approved by the FDA to exclude DVT/VTE. Results should be correlated with clinical presentation. Performed at [facility], [place] [address] [place],  [address]   Troponin I (High Sensitivity)     Status:  None   Collection Time: 03/22/19  1:28 PM  Result Value Ref Range   Troponin I (High Sensitivity) 8 <18 ng/L    Comment: (NOTE) Elevated high sensitivity troponin I (hsTnI) values and significant  changes across serial measurements may suggest ACS but many other  chronic and acute conditions are known to elevate hsTnI results.  Refer to the "Links" section for chest pain algorithms and additional  guidance. Performed at [facility], [address] [place], [place] [id number]    Dg Chest 2 View  Result Date: 03/22/2019 CLINICAL DATA:  Chest tightness, shortness of breath. EXAM: CHEST - 2 VIEW COMPARISON:  None. FINDINGS: Cardiac pacemaker in in place. Cardiomediastinal silhouette is normal. Mediastinal contours appear intact. Elevation of the right hemidiaphragm. Bibasilar atelectasis versus peribronchial airspace consolidation. Low lung volumes. Osseous structures are without acute abnormality. Soft tissues are grossly normal. IMPRESSION: 1. Low lung volumes. 2. Bibasilar atelectasis versus peribronchial airspace consolidation. Electronically Signed   By: Ted Mcalpineobrinka  Dimitrova M.D.   On: 03/22/2019 12:57    Pending Labs Unresulted Labs (From admission, onward)    Start     Ordered   03/22/19 1507  SARS Coronavirus 2 (CEPHEID - Performed in [facility] hospital lab), Hosp Order  (Asymptomatic Patients Labs)  Once,   STAT    Question:  Rule Out  Answer:  Yes   03/22/19 1506   Signed and Held  Basic metabolic panel  Tomorrow morning,   R     Signed and Held   Signed and Held  CBC  Tomorrow morning,   R     Signed and Held          Vitals/Pain Today's Vitals   03/22/19 1400 03/22/19 1445 03/22/19 1500 03/22/19 1558  BP: (!) 162/83  (!) 158/76   Pulse: 89  60   Resp: 19     Temp:      TempSrc:      SpO2: 97% 97%    Weight:      Height:      PainSc:    0-No pain    Isolation Precautions No active isolations  Medications Medications  sodium chloride flush (NS) 0.9 %  injection 3 mL (has no administration in time range)    Mobility walks with device Low fall risk   Focused Assessments    R Recommendations: See Admitting Provider Note  Report given to:   Additional Notes:  Very sweet pt. Uses a walker to ambulate. Covid test performed and being sent off for processing now.

## 2019-03-23 ENCOUNTER — Observation Stay (HOSPITAL_BASED_OUTPATIENT_CLINIC_OR_DEPARTMENT_OTHER): Payer: Medicare Other

## 2019-03-23 ENCOUNTER — Other Ambulatory Visit: Payer: Self-pay | Admitting: Physician Assistant

## 2019-03-23 DIAGNOSIS — I361 Nonrheumatic tricuspid (valve) insufficiency: Secondary | ICD-10-CM

## 2019-03-23 DIAGNOSIS — R002 Palpitations: Secondary | ICD-10-CM | POA: Diagnosis present

## 2019-03-23 DIAGNOSIS — R0789 Other chest pain: Secondary | ICD-10-CM | POA: Diagnosis not present

## 2019-03-23 DIAGNOSIS — I5032 Chronic diastolic (congestive) heart failure: Secondary | ICD-10-CM | POA: Diagnosis not present

## 2019-03-23 DIAGNOSIS — Z9989 Dependence on other enabling machines and devices: Secondary | ICD-10-CM

## 2019-03-23 DIAGNOSIS — G4733 Obstructive sleep apnea (adult) (pediatric): Secondary | ICD-10-CM

## 2019-03-23 DIAGNOSIS — Z794 Long term (current) use of insulin: Secondary | ICD-10-CM

## 2019-03-23 DIAGNOSIS — I341 Nonrheumatic mitral (valve) prolapse: Secondary | ICD-10-CM

## 2019-03-23 DIAGNOSIS — R072 Precordial pain: Secondary | ICD-10-CM

## 2019-03-23 DIAGNOSIS — E1129 Type 2 diabetes mellitus with other diabetic kidney complication: Secondary | ICD-10-CM

## 2019-03-23 DIAGNOSIS — I10 Essential (primary) hypertension: Secondary | ICD-10-CM

## 2019-03-23 DIAGNOSIS — I482 Chronic atrial fibrillation, unspecified: Secondary | ICD-10-CM | POA: Diagnosis not present

## 2019-03-23 LAB — BASIC METABOLIC PANEL
Anion gap: 12 (ref 5–15)
BUN: 15 mg/dL (ref 8–23)
CO2: 24 mmol/L (ref 22–32)
Calcium: 9.1 mg/dL (ref 8.9–10.3)
Chloride: 105 mmol/L (ref 98–111)
Creatinine, Ser: 0.8 mg/dL (ref 0.44–1.00)
GFR calc Af Amer: >60 mL/min (ref >60)
GFR calc non Af Amer: >60 mL/min (ref >60)
Glucose, Bld: 98 mg/dL (ref 70–99)
Potassium: 3.9 mmol/L (ref 3.5–5.1)
Sodium: 141 mmol/L (ref 135–145)

## 2019-03-23 LAB — CBC
HCT: 38.5 % (ref 36.0–46.0)
Hemoglobin: 12.7 g/dL (ref 12.0–15.0)
MCH: 32.4 pg (ref 26.0–34.0)
MCHC: 33 g/dL (ref 30.0–36.0)
MCV: 98.2 fL (ref 80.0–100.0)
Platelets: 177 10*3/uL (ref 150–400)
RBC: 3.92 MIL/uL (ref 3.87–5.11)
RDW: 15.3 % (ref 11.5–15.5)
WBC: 6 10*3/uL (ref 4.0–10.5)
nRBC: 0 % (ref 0.0–0.2)

## 2019-03-23 LAB — PROTIME-INR
INR: 1.5 — ABNORMAL HIGH (ref 0.8–1.2)
Prothrombin Time: 17.9 seconds — ABNORMAL HIGH (ref 11.4–15.2)

## 2019-03-23 LAB — GLUCOSE, CAPILLARY
Glucose-Capillary: 125 mg/dL — ABNORMAL HIGH (ref 70–99)
Glucose-Capillary: 118 mg/dL — ABNORMAL HIGH (ref 70–99)
Glucose-Capillary: 104 mg/dL — ABNORMAL HIGH (ref 70–99)
Glucose-Capillary: 125 mg/dL — ABNORMAL HIGH (ref 70–99)

## 2019-03-23 LAB — ECHOCARDIOGRAM COMPLETE
Height: 60 in
Weight: 2691.2 oz

## 2019-03-23 MED ORDER — MUPIROCIN 2 % EX OINT
1.0000 "application " | TOPICAL_OINTMENT | Freq: Two times a day (BID) | CUTANEOUS | Status: DC
  Administered 2019-03-23 – 2019-03-24 (×3): 1 via NASAL
  Filled 2019-03-23 (×2): qty 22

## 2019-03-23 MED ORDER — AMLODIPINE BESYLATE 5 MG PO TABS
5.0000 mg | ORAL_TABLET | Freq: Every day | ORAL | Status: DC
  Administered 2019-03-24: 5 mg via ORAL
  Filled 2019-03-23: qty 1

## 2019-03-23 MED ORDER — CHLORHEXIDINE GLUCONATE CLOTH 2 % EX PADS
6.0000 | MEDICATED_PAD | Freq: Every day | CUTANEOUS | Status: DC
  Administered 2019-03-23: 12:00:00 6 via TOPICAL

## 2019-03-23 MED ORDER — FLUTICASONE FUROATE-VILANTEROL 100-25 MCG/INH IN AEPB
1.0000 | INHALATION_SPRAY | Freq: Every day | RESPIRATORY_TRACT | Status: DC
  Administered 2019-03-23 – 2019-03-24 (×2): 1 via RESPIRATORY_TRACT
  Filled 2019-03-23: qty 28

## 2019-03-23 MED ORDER — WARFARIN SODIUM 7.5 MG PO TABS
7.5000 mg | ORAL_TABLET | Freq: Once | ORAL | Status: AC
  Administered 2019-03-23: 7.5 mg via ORAL
  Filled 2019-03-23: qty 1

## 2019-03-23 MED ORDER — WARFARIN - PHARMACIST DOSING INPATIENT
Freq: Every day | Status: DC
  Administered 2019-03-23: 17:00:00

## 2019-03-23 NOTE — Progress Notes (Signed)
  Patient refused CPAP for tonight.  Encouraged patient to call for Respiratory if she changed her mind and would like CPAP during her stay. States she is supposed to go home tomorrow.

## 2019-03-23 NOTE — Progress Notes (Signed)
PROGRESS NOTE    Pennie RushingCora Nolet  WUJ:[id number] DOB: Nov 14, 1940 DOA: 03/22/2019 PCP: System, Pcp Not In    Brief Narrative:  HPI per Dr. Milton FergusonARRIEN Lauren Holland is a 78 y.o. female with medical history significant of recent hospitalization 06/23 to 06/27, due to right foot gout flare, and Klebsiella urinary tract infection. She also has chronic diastolic heart failure, chronic atrial fibrillation, type 2 diabetes mellitus, hypertension, chronic kidney disease stage III, COPD, and GERD.   She has been doing daily physical therapy at the skilled nursing facility, and she has noticed increased fatigue with each time of therapy session.  Today she was sitting and after 60 minutes of receiving her oral medications, she felt lightheaded, positive palpitations and chest pressure.  The palpitations were moderate to severe intensity, no improving or worsening factors, no associated dyspnea or diaphoresis, transitory in nature.  Her vitals were checked and reported to be normal.  She talked to her son who was concerned about his mother symptoms and requested her to be transported to the emergency room.  ED Course: Patient still felt lightheaded, her vitals have been normal, troponins negative, d-dimer was elevated.  He was referred for further evaluation.  Assessment & Plan:   Principal Problem:   Heart palpitations Active Problems:   Chest pain   Chronic diastolic (congestive) heart failure (HCC)   Atrial fibrillation, chronic   CKD (chronic kidney disease), stage III (HCC)   COPD (chronic obstructive pulmonary disease) (HCC)   Depression   GERD (gastroesophageal reflux disease)   Gout   HLD (hyperlipidemia)   HTN (hypertension)   OSA on CPAP   Type II diabetes mellitus with renal manifestations (HCC)  1 chest pain/palpitations On admission it was noted that patient had presented with complaints of chest pain and noted to have palpitations which are moderate to severe in intensity.  Patient on  evaluation today denies any ongoing chest pain and states his symptoms were more from palpitations.  BNP of 90.1.  Cardiac enzymes essentially negative.  D-dimer was elevated at 1.47.  Patient noted to have a subtherapeutic INR this morning of 1.5.  CT angiogram chest done with limited evaluation for emboli within the subsegmental vessels of the lower lobes due to respiratory motion artifact, no acute central embolus.  20 x 11 mm ovoid consolidation in the left lower lobe slightly more solid in the interim consider follow-up repeat chest CT in 3 months or follow-up PET/CT or tissue sampling.  2D echo ordered.  Continue current regimen of Toprol-XL, Lasix, Norvasc.  Due to patient's complex cardiac history will consult with cardiology for further evaluation and management.  2.  OSA CPAP nightly.  3.  Type 2 diabetes mellitus Hemoglobin A1c was 7.4 on 03/05/2019.  CBG of 125 this morning.  Continue sliding scale insulin.  4.  COPD Stable.  Continue Incruse, Breo Ellipta, Singulair, Protonix.  5.  Chronic diastolic heart failure Currently euvolemic.  2D echo pending.  Continue Toprol-XL, Lasix.  Cardiology consulted.  6.  Hypertension Continue Norvasc and Toprol-XL.  7.  Moderate aortic stenosis 2D echo pending.  Cardiology consulted.  8.  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  Continue Toprol-XL.  INR subtherapeutic at 1.5.  Coumadin per pharmacy.  No need for bridging with Lovenox.  Follow.  9.  Carotid artery disease Outpatient follow-up.  10.  20 x 11 mm obvious consolidation on CT chest Outpatient follow-up with repeat chest CT  or PET CT in 3 months.  Doubt if patient has pneumonia.  No need for antibiotics at this time.   DVT prophylaxis: Coumadin Code Status: DNR Family Communication: Updated patient.  No family at bedside. Disposition Plan: SNF versus home with home health when okay with  cardiology hopefully in the next 24 hours.   Consultants:   Cardiology: Dr. Anne FuSkains 03/23/2019  Procedures:   2D echo 03/23/2019  Chest x-ray 03/22/2019  CT angiogram chest 03/22/2019  Antimicrobials:  None   Subjective: Patient laying in bed.  Patient denies any active chest pain.  Feels palpitations have improved since admission.  Denies any shortness of breath.  Not interested in going back to a skilled nursing facility stating she will be going home to her son's house.  Objective: Vitals:   03/23/19 0440 03/23/19 0822 03/23/19 1043 03/23/19 1447  BP: (!) 156/87 (!) 133/57  (!) 147/78  Pulse: 61 67  73  Resp: 15     Temp: 97.6 F (36.4 C)   98.3 F (36.8 C)  TempSrc: Oral   Oral  SpO2: 95%  96% 98%  Weight: 76.3 kg     Height:        Intake/Output Summary (Last 24 hours) at 03/23/2019 1706 Last data filed at 03/23/2019 1200 Gross per 24 hour  Intake 1443 ml  Output 1200 ml  Net 243 ml   Filed Weights   03/22/19 1137 03/22/19 1642 03/23/19 0440  Weight: 77.1 kg 77.2 kg 76.3 kg    Examination:  General exam: Appears calm and comfortable  Respiratory system: Clear to auscultation. Respiratory effort normal. Cardiovascular system: Regular rate rhythm with a 2/6 systolic ejection murmur right upper sternal border.  No JVD.  No lower extremity edema.  Gastrointestinal system: Abdomen is soft, nontender, nondistended, positive bowel sounds.  No rebound.  No guarding.  Central nervous system: Alert and oriented. No focal neurological deficits. Extremities: Symmetric 5 x 5 power. Skin: No rashes, lesions or ulcers Psychiatry: Judgement and insight appear normal. Mood & affect appropriate.     Data Reviewed: I have personally reviewed following labs and imaging studies  CBC: Recent Labs  Lab 03/22/19 1208 03/23/19 0341  WBC 6.5 6.0  HGB 12.5 12.7  HCT 39.2 38.5  MCV 101.3* 98.2  PLT 183 177   Basic Metabolic Panel: Recent Labs  Lab 03/22/19 1208  03/23/19 0341  NA 141 141  K 3.7 3.9  CL 105 105  CO2 25 24  GLUCOSE 99 98  BUN 15 15  CREATININE 0.84 0.80  CALCIUM 9.4 9.1   GFR: Estimated Creatinine Clearance: 54.6 mL/min (by C-G formula based on SCr of 0.8 mg/dL). Liver Function Tests: No results for input(s): AST, ALT, ALKPHOS, BILITOT, PROT, ALBUMIN in the last 168 hours. No results for input(s): LIPASE, AMYLASE in the last 168 hours. No results for input(s): AMMONIA in the last 168 hours. Coagulation Profile: Recent Labs  Lab 03/22/19 1208 03/23/19 0341  INR 2.1* 1.5*   Cardiac Enzymes: No results for input(s): CKTOTAL, CKMB, CKMBINDEX, TROPONINI in the last 168 hours. BNP (last 3 results) No results for input(s): PROBNP in the last 8760 hours. HbA1C: No results for input(s): HGBA1C in the last 72 hours. CBG: Recent Labs  Lab 03/22/19 1653 03/22/19 2253 03/23/19 0746 03/23/19 1151 03/23/19 1656  GLUCAP 74 107* 125* 118* 104*   Lipid Profile: No results for input(s): CHOL, HDL, LDLCALC, TRIG, CHOLHDL, LDLDIRECT in the last 72 hours. Thyroid Function Tests: No results for  input(s): TSH, T4TOTAL, FREET4, T3FREE, THYROIDAB in the last 72 hours. Anemia Panel: No results for input(s): VITAMINB12, FOLATE, FERRITIN, TIBC, IRON, RETICCTPCT in the last 72 hours. Sepsis Labs: No results for input(s): PROCALCITON, LATICACIDVEN in the last 168 hours.  Recent Results (from the past 240 hour(s))  SARS Coronavirus 2 (CEPHEID - Performed in [facility] hospital lab), Hosp Order     Status: None   Collection Time: 03/22/19  4:01 PM   Specimen: Nasopharyngeal Swab  Result Value Ref Range Status   SARS Coronavirus 2 NEGATIVE NEGATIVE Final    Comment: (NOTE) If result is NEGATIVE SARS-CoV-2 target nucleic acids are NOT DETECTED. The SARS-CoV-2 RNA is generally detectable in upper and lower  respiratory specimens during the acute phase of infection. The lowest  concentration of SARS-CoV-2 viral copies this assay can  detect is 250  copies / mL. A negative result does not preclude SARS-CoV-2 infection  and should not be used as the sole basis for treatment or other  patient management decisions.  A negative result may occur with  improper specimen collection / handling, submission of specimen other  than nasopharyngeal swab, presence of viral mutation(s) within the  areas targeted by this assay, and inadequate number of viral copies  (<250 copies / mL). A negative result must be combined with clinical  observations, patient history, and epidemiological information. If result is POSITIVE SARS-CoV-2 target nucleic acids are DETECTED. The SARS-CoV-2 RNA is generally detectable in upper and lower  respiratory specimens dur ing the acute phase of infection.  Positive  results are indicative of active infection with SARS-CoV-2.  Clinical  correlation with patient history and other diagnostic information is  necessary to determine patient infection status.  Positive results do  not rule out bacterial infection or co-infection with other viruses. If result is PRESUMPTIVE POSTIVE SARS-CoV-2 nucleic acids MAY BE PRESENT.   A presumptive positive result was obtained on the submitted specimen  and confirmed on repeat testing.  While 2019 novel coronavirus  (SARS-CoV-2) nucleic acids may be present in the submitted sample  additional confirmatory testing may be necessary for epidemiological  and / or clinical management purposes  to differentiate between  SARS-CoV-2 and other Sarbecovirus currently known to infect humans.  If clinically indicated additional testing with an alternate test  methodology [phone]) is advised. The SARS-CoV-2 RNA is generally  detectable in upper and lower respiratory sp ecimens during the acute  phase of infection. The expected result is Negative. Fact Sheet for Patients:  BoilerBrush.com.cy Fact Sheet for Healthcare  Providers: https://pope.com/ This test is not yet approved or cleared by the [place] FDA and has been authorized for detection and/or diagnosis of SARS-CoV-2 by FDA under an Emergency Use Authorization (EUA).  This EUA will remain in effect (meaning this test can be used) for the duration of the COVID-19 declaration under Section 564(b)(1) of the Act, 21 U.S.C. section 360bbb-3(b)(1), unless the authorization is terminated or revoked sooner. Performed at [facility], [address] [place], [place] [address]   MRSA PCR Screening     Status: Abnormal   Collection Time: 03/22/19  7:29 PM   Specimen: Nasal Mucosa; Nasopharyngeal  Result Value Ref Range Status   MRSA by PCR POSITIVE (A) NEGATIVE Final    Comment:        The GeneXpert MRSA Assay (FDA approved for NASAL specimens only), is one component of a comprehensive MRSA colonization surveillance program. It is not intended to diagnose MRSA infection  nor to guide or monitor treatment for MRSA infections. RESULT CALLED TO, READ BACK BY AND VERIFIED WITH: HELUM,M RN [id number] 03/22/2019 MITCHELL,L Performed at [facility], [place] [address] [place], [place] [address]          Radiology Studies: Dg Chest 2 View  Result Date: 03/22/2019 CLINICAL DATA:  Chest tightness, shortness of breath. EXAM: CHEST - 2 VIEW COMPARISON:  None. FINDINGS: Cardiac pacemaker in in place. Cardiomediastinal silhouette is normal. Mediastinal contours appear intact. Elevation of the right hemidiaphragm. Bibasilar atelectasis versus peribronchial airspace consolidation. Low lung volumes. Osseous structures are without acute abnormality. Soft tissues are grossly normal. IMPRESSION: 1. Low lung volumes. 2. Bibasilar atelectasis versus peribronchial airspace consolidation. Electronically Signed   By: Fidela Salisbury M.D.   On: 03/22/2019 12:57   Ct Angio Chest Pe W Or Wo Contrast  Result Date: 03/22/2019 CLINICAL  DATA:  Palpitation with atypical chest pain EXAM: CT ANGIOGRAPHY CHEST WITH CONTRAST TECHNIQUE: Multidetector CT imaging of the chest was performed using the standard protocol during bolus administration of intravenous contrast. Multiplanar CT image reconstructions and MIPs were obtained to evaluate the vascular anatomy. CONTRAST:  9mL OMNIPAQUE IOHEXOL 350 MG/ML SOLN COMPARISON:  Chest x-ray 03/22/2019 FINDINGS: Cardiovascular: Satisfactory opacification of the pulmonary arteries to the segmental level. Respiratory motion artifact limits evaluation for emboli within the subsegmental branches of the lower lobes. No filling defect within the central or main segmental pulmonary arteries to suggest acute central embolus. Nonaneurysmal aorta. Moderate aortic atherosclerosis. Coronary vascular calcification. Normal heart size. No pericardial effusion. Partially visualized intracardiac pacing leads. Reflux of contrast into the IVC suggesting elevated right heart pressures. Mediastinum/Nodes: Midline trachea. No thyroid mass. Subcentimeter mediastinal lymph nodes. Esophagus within normal limits. Lungs/Pleura: Mild mosaic pattern in the upper lobes, possible small airways disease. Oval nodular area of consolidation within the left lower lobe measuring 20 x 11 mm, appears slightly more solid in the interim. Negative for pneumothorax or pleural effusion Upper Abdomen: No acute abnormality. Musculoskeletal: Degenerative changes. No acute or suspicious abnormality. Review of the MIP images confirms the above findings. IMPRESSION: 1. Limited evaluation for emboli within the subsegmental vessels of the lower lobes due to respiratory motion artifact. No acute central embolus. 2. 20 x 11 mm ovoid consolidation in the left lower lobe, slightly more solid in the interim. Consider one of the following in 3 months for both low-risk and high-risk individuals: (a) repeat chest CT, (b) follow-up PET-CT, or (c) tissue sampling. This  recommendation follows the consensus statement: Guidelines for Management of Incidental Pulmonary Nodules Detected on CT Images: From the Fleischner Society 2017; Radiology 2017; 284:228-243. 3. Reflux of contrast into the hepatic veins suggesting elevated right heart pressures. Aortic Atherosclerosis (ICD10-I70.0). Electronically Signed   By: Donavan Foil M.D.   On: 03/22/2019 21:48        Scheduled Meds:  [START ON 03/24/2019] amLODipine  5 mg Oral Daily   busPIRone  10 mg Oral BID   Chlorhexidine Gluconate Cloth  6 each Topical Q0600   colchicine  0.6 mg Oral Daily   fluticasone furoate-vilanterol  1 puff Inhalation Daily   furosemide  20 mg Oral BID   insulin aspart  0-9 Units Subcutaneous TID WC   metoprolol succinate  100 mg Oral Daily   And   metoprolol succinate  50 mg Oral QHS   montelukast  10 mg Oral QHS   mupirocin ointment  1 application Nasal BID   pantoprazole  20 mg Oral Daily  potassium chloride  10 mEq Oral BID   umeclidinium bromide  1 puff Inhalation Daily   warfarin  7.5 mg Oral ONCE-1800   Warfarin - Pharmacist Dosing Inpatient   Does not apply q1800   Continuous Infusions:   LOS: 0 days    Time spent: 40 minutes    Ramiro Harvestaniel Zaryia Markel, MD [facility]  If 7PM-7AM, please contact night-coverage [email or web address] 03/23/2019, 5:06 PM

## 2019-03-23 NOTE — Progress Notes (Signed)
  Echocardiogram 2D Echocardiogram has been performed.  Jennette Dubin 03/23/2019, 10:54 AM

## 2019-03-23 NOTE — Progress Notes (Signed)
Call from RN, patient decided she would like to use CPAP. Placed on 12.0 cm H20 per patient home settings, via [facility]. Tolerating well at this time. RN aware.

## 2019-03-23 NOTE — Consult Note (Addendum)
Cardiology Consultation:   Patient ID: Lauren RushingCora Holland MRN: [id number]; DOB: [birth year]  Admit date: 03/22/2019 Date of Consult: 03/23/2019  Primary Care Provider: System, Pcp Not In Primary Cardiologist: New to [facility] Previous Cardiologist: Dr. Hilton SinclairWilliam Miles ([facility] in [place], [place])   Primary Electrophysiologist:  None  (Previous Dr. Marvis MoellerMiles in [place], [place])   Patient Profile:   Lauren RushingCora Holland is a 78 y.o. female with a hx of non-ischemic cardiomyopathy with recovered EF, diastolic CHF, permanent AFib, s/p AV node ablation and BiV pacer, aortic stenosis, COPD on home O2, diabetes mellitus, chronic kidney disease, prior TIA, PAD who is being seen today for the evaluation of chest pain at the request of Dr. Erin HearingMauricio Arrien.  History of Present Illness:   Lauren Holland previously lived in [place] and was followed by Dr. Hilton SinclairWilliam Miles with [facility] in [place].  Her records are in CareEverywhere.  She has a hx of permanent atrial fibrillation and underwent AV nodal ablation with placement of a BiV pacemaker in 2013.  She also has a hx of non-ischemic cardiomyopathy with EF 30-35% in the past.  Her EF has improved to normal and she does have chronic diastolic CHF.  Records indicate she previously had an ICD that was explanted in 2010 due to infection.    Echo in 03/13/18 [facility]([place]): EF 55-60, mod AS (mean 22), mild to mod AI, mild to mod MR Cardiac Catheterization in 10/2013 [facility]([place]): irregularities in the LM and LAD, mild AS with mean 11 mmHg. EF 55. ABIs 04/2012: R 0.65, L 0.61  She was recently admitted with gout and a UTI and DC to SNF.  She developed palpitations, lightheadedness and chest pain yesterday at the SNF and was brought to the ED.  She has recently noted increasing fatigue with PT.    Data since admit: K 3.9, Creatinine 0.86, Hgb 12.7 hsTrop 9 >> 8 INR 2.1 >> 1.5 COVID test neg Chest CT - no large central PE, LLL consolidation  Echo pending.  She notes  fatigue/exhaustion when she exercises with physical therapy.  She feels that she may be short of breath as well.  This does not seem to be getting any better.  Yesterday, she had palpitations.  She is unsure how long these lasted.  She had some chest discomfort with this.  She has not had any further symptoms.  She sleeps on an incline chronically.  She denies paroxysmal nocturnal dyspnea.  She denies significant lower extremity swelling.  She denies syncope.  Heart Pathway Score:     Past Medical History:  Diagnosis Date   Atrial fibrillation, chronic    Chronic diastolic (congestive) heart failure (HCC)    CKD (chronic kidney disease), stage III (HCC)    COPD (chronic obstructive pulmonary disease) (HCC)    Depression    GERD (gastroesophageal reflux disease)    Gout    HLD (hyperlipidemia)    HTN (hypertension)    LBBB (left bundle branch block)    OSA on CPAP    Pacemaker    Type II diabetes mellitus with renal manifestations (HCC)     Past Surgical History:  Procedure Laterality Date   PACEMAKER PLACEMENT       Home Medications:  Prior to Admission medications   Medication Sig Start Date End Date Taking? Authorizing Provider  acetaminophen (TYLENOL) 325 MG tablet Take 2 tablets (650 mg total) by mouth every 6 (six) hours as needed for mild pain (or Fever >/= 101). Patient taking differently:  Take 650 mg by mouth every 6 (six) hours as needed for mild pain or headache (or Fever >/= 101).  03/08/19  Yes Zannie Cove, MD  amLODipine (NORVASC) 2.5 MG tablet Take 2.5 mg by mouth daily.   Yes [provider]  busPIRone (BUSPAR) 10 MG tablet Take 10 mg by mouth 2 (two) times a day.   Yes [provider]  colchicine 0.6 MG tablet Take 1 tablet (0.6 mg total) by mouth daily. Patient taking differently: Take 0.6 mg by mouth every 12 (twelve) hours.  03/03/19  Yes Renne Crigler, PA-C  Fluticasone-Umeclidin-Vilant 100-62.5-25 MCG/INH AEPB Inhale 1 puff  into the lungs daily.   Yes [provider]  furosemide (LASIX) 20 MG tablet Take 20 mg by mouth daily.  03/16/18  Yes [provider]  Insulin Detemir (LEVEMIR FLEXTOUCH) 100 UNIT/ML Pen Inject 30 Units into the skin at bedtime. 03/08/19  Yes Zannie Cove, MD  lansoprazole (PREVACID) 30 MG capsule Take 30 mg by mouth daily.   Yes [provider]  metFORMIN (GLUCOPHAGE) 500 MG tablet Take 500 mg by mouth daily.   Yes [provider]  metoprolol succinate (TOPROL-XL) 50 MG 24 hr tablet Take 50-100 mg by mouth See admin instructions. Take 100 mg by mouth in the morning and 50 mg at bedtime 01/25/16  Yes [provider]  montelukast (SINGULAIR) 10 MG tablet Take 10 mg by mouth at bedtime.   Yes [provider]  nitroGLYCERIN (NITROSTAT) 0.4 MG SL tablet Place 1 tablet under the tongue every 5 (five) minutes x 3 doses as needed for chest pain.  11/29/18  Yes [provider]  potassium chloride (K-DUR) 10 MEQ tablet Take 10 mEq by mouth 2 (two) times a day. 10/04/15  Yes [provider]  PRESCRIPTION MEDICATION See admin instructions. BiPAP: "Every evening and night shift"   Yes [provider]  enoxaparin (LOVENOX) 80 MG/0.8ML injection Inject 0.8 mLs (80 mg total) into the skin 2 (two) times daily for 2 days. Patient not taking: Reported on 03/22/2019 03/08/19 03/22/19  Zannie Cove, MD  warfarin (COUMADIN) 5 MG tablet Take 1.5 tablets (7.5 mg total) by mouth daily. Take 7.5mg  for 2days then resume  daily for INR 2-3 Patient taking differently: Take 7.5 mg by mouth See admin instructions. Take 7.5 mg by mouth once a day at 5 PM and HOLD from 03/21/2019 through 03/22/2019 03/08/19   Zannie Cove, MD    Inpatient Medications: Scheduled Meds:  amLODipine  2.5 mg Oral Daily   busPIRone  10 mg Oral BID   Chlorhexidine Gluconate Cloth  6 each Topical Q0600   colchicine  0.6 mg Oral Daily   fluticasone  furoate-vilanterol  1 puff Inhalation Daily   furosemide  20 mg Oral BID   insulin aspart  0-9 Units Subcutaneous TID WC   metoprolol succinate  100 mg Oral Daily   And   metoprolol succinate  50 mg Oral QHS   montelukast  10 mg Oral QHS   mupirocin ointment  1 application Nasal BID   pantoprazole  20 mg Oral Daily   potassium chloride  10 mEq Oral BID   umeclidinium bromide  1 puff Inhalation Daily   warfarin  7.5 mg Oral ONCE-1800   Warfarin - Pharmacist Dosing Inpatient   Does not apply q1800   Continuous Infusions:  PRN Meds: acetaminophen, nitroGLYCERIN, ondansetron **OR** ondansetron (ZOFRAN) IV  Allergies:    Allergies  Allergen Reactions   Codeine Palpitations and Other (  See Comments)    "Allergic," per MAR   Sulfa Antibiotics Anaphylaxis    "Allergic," per MAR   Sulfamethoxazole-Trimethoprim Anaphylaxis    "Allergic," per MAR   Metoclopramide Other (See Comments)    Tremors and "Allergic," per MAR    Fenofibrate Other (See Comments)    "Allergic," per [facility]    Social History:   Social History   Socioeconomic History   Marital status: Single    Spouse name: Not on file   Number of children: Not on file   Years of education: Not on file   Highest education level: Not on file  Occupational History   Not on file  Social Needs   Financial resource strain: Not on file   Food insecurity    Worry: Not on file    Inability: Not on file   Transportation needs    Medical: Not on file    Non-medical: Not on file  Tobacco Use   Smoking status: Never Smoker   Smokeless tobacco: Never Used  Substance and Sexual Activity   Alcohol use: Not Currently   Drug use: Never   Sexual activity: Not on file  Lifestyle   Physical activity    Days per week: Not on file    Minutes per session: Not on file   Stress: Not on file  Relationships   Social connections    Talks on phone: Not on file    Gets together: Not on file    Attends  religious service: Not on file    Active member of club or organization: Not on file    Attends meetings of clubs or organizations: Not on file    Relationship status: Not on file   Intimate partner violence    Fear of current or ex partner: Not on file    Emotionally abused: Not on file    Physically abused: Not on file    Forced sexual activity: Not on file  Other Topics Concern   Not on file  Social History Narrative   Not on file    Family History:    Family History  Problem Relation Age of Onset   Diabetes Mellitus II Mother    Hypertension Mother    Hypertension Father    Diabetes Mellitus II Sister    Hypertension Sister      ROS:  Please see the history of present illness.  She has chronic leg pain secondary to neuropathy.  She does not have significant claudication.  She has not had any fever, vomiting, diarrhea.  She has had a mild cough over the last couple months. All other ROS reviewed and negative.     Physical Exam/Data:   Vitals:   03/22/19 1817 03/23/19 0440 03/23/19 0822 03/23/19 1043  BP: (!) 151/55 (!) 156/87 (!) 133/57   Pulse:  61 67   Resp:  15    Temp:  97.6 F (36.4 C)    TempSrc:  Oral    SpO2:  95%  96%  Weight:  76.3 kg    Height:        Intake/Output Summary (Last 24 hours) at 03/23/2019 1107 Last data filed at 03/23/2019 1106 Gross per 24 hour  Intake 1083 ml  Output 1200 ml  Net -117 ml   Last 3 Weights 03/23/2019 03/22/2019 03/22/2019  Weight (lbs) 168 lb 3.2 oz 170 lb 1.6 oz 170 lb  Weight (kg) 76.295 kg 77.157 kg 77.111 kg     Body mass index is  32.85 kg/m.  General:  Well nourished, well developed, in no acute distress  HEENT: normal Lymph: no adenopathy Neck: no JVD Endocrine:  No thryomegaly Vascular: No carotid bruits  Cardiac: Regular rate and rhythm, 2/6 harsh crescendo decrescendo systolic murmur heard best at the right sternal border Lungs:  clear to auscultation bilaterally, no wheezing, rhonchi or rales    Abd: soft, nontender, no hepatomegaly  Ext: no edema Musculoskeletal:  No deformities Skin: warm and dry  Neuro:  CNs 2-12 intact, no focal abnormalities noted Psych:  Normal affect   EKG:  The EKG done yesterday was personally reviewed and demonstrates:   V paced rhythm, HR 66, probable underlying AFib  Telemetry:  Telemetry was personally reviewed and demonstrates: Ventricular paced, heart rate 60s  Relevant CV Studies: Echo pending   Laboratory Data:  High Sensitivity Troponin:   Recent Labs  Lab 03/22/19 1208 03/22/19 1328  TROPONINIHS 9 8     Cardiac EnzymesNo results for input(s): TROPONINI in the last 168 hours. No results for input(s): TROPIPOC in the last 168 hours.  Chemistry Recent Labs  Lab 03/22/19 1208 03/23/19 0341  NA 141 141  K 3.7 3.9  CL 105 105  CO2 25 24  GLUCOSE 99 98  BUN 15 15  CREATININE 0.84 0.80  CALCIUM 9.4 9.1  GFRNONAA >60 >60  GFRAA >60 >60  ANIONGAP 11 12    No results for input(s): PROT, ALBUMIN, AST, ALT, ALKPHOS, BILITOT in the last 168 hours. Hematology Recent Labs  Lab 03/22/19 1208 03/23/19 0341  WBC 6.5 6.0  RBC 3.87 3.92  HGB 12.5 12.7  HCT 39.2 38.5  MCV 101.3* 98.2  MCH 32.3 32.4  MCHC 31.9 33.0  RDW 15.3 15.3  PLT 183 177   BNP Recent Labs  Lab 03/22/19 1208  BNP 90.1    DDimer  Recent Labs  Lab 03/22/19 1208  DDIMER 1.47*     Radiology/Studies:  Dg Chest 2 View  Result Date: 03/22/2019 CLINICAL DATA:  Chest tightness, shortness of breath. EXAM: CHEST - 2 VIEW COMPARISON:  None. FINDINGS: Cardiac pacemaker in in place. Cardiomediastinal silhouette is normal. Mediastinal contours appear intact. Elevation of the right hemidiaphragm. Bibasilar atelectasis versus peribronchial airspace consolidation. Low lung volumes. Osseous structures are without acute abnormality. Soft tissues are grossly normal. IMPRESSION: 1. Low lung volumes. 2. Bibasilar atelectasis versus peribronchial airspace consolidation.  Electronically Signed   By: Fidela Salisbury M.D.   On: 03/22/2019 12:57   Ct Angio Chest Pe W Or Wo Contrast  Result Date: 03/22/2019 CLINICAL DATA:  Palpitation with atypical chest pain EXAM: CT ANGIOGRAPHY CHEST WITH CONTRAST TECHNIQUE: Multidetector CT imaging of the chest was performed using the standard protocol during bolus administration of intravenous contrast. Multiplanar CT image reconstructions and MIPs were obtained to evaluate the vascular anatomy. CONTRAST:  60mL OMNIPAQUE IOHEXOL 350 MG/ML SOLN COMPARISON:  Chest x-ray 03/22/2019 FINDINGS: Cardiovascular: Satisfactory opacification of the pulmonary arteries to the segmental level. Respiratory motion artifact limits evaluation for emboli within the subsegmental branches of the lower lobes. No filling defect within the central or main segmental pulmonary arteries to suggest acute central embolus. Nonaneurysmal aorta. Moderate aortic atherosclerosis. Coronary vascular calcification. Normal heart size. No pericardial effusion. Partially visualized intracardiac pacing leads. Reflux of contrast into the IVC suggesting elevated right heart pressures. Mediastinum/Nodes: Midline trachea. No thyroid mass. Subcentimeter mediastinal lymph nodes. Esophagus within normal limits. Lungs/Pleura: Mild mosaic pattern in the upper lobes, possible small airways disease. Oval nodular area of consolidation  within the left lower lobe measuring 20 x 11 mm, appears slightly more solid in the interim. Negative for pneumothorax or pleural effusion Upper Abdomen: No acute abnormality. Musculoskeletal: Degenerative changes. No acute or suspicious abnormality. Review of the MIP images confirms the above findings. IMPRESSION: 1. Limited evaluation for emboli within the subsegmental vessels of the lower lobes due to respiratory motion artifact. No acute central embolus. 2. 20 x 11 mm ovoid consolidation in the left lower lobe, slightly more solid in the interim. Consider one  of the following in 3 months for both low-risk and high-risk individuals: (a) repeat chest CT, (b) follow-up PET-CT, or (c) tissue sampling. This recommendation follows the consensus statement: Guidelines for Management of Incidental Pulmonary Nodules Detected on CT Images: From the Fleischner Society 2017; Radiology 2017; 284:228-243. 3. Reflux of contrast into the hepatic veins suggesting elevated right heart pressures. Aortic Atherosclerosis (ICD10-I70.0). Electronically Signed   By: Jasmine PangKim  Fujinaga M.D.   On: 03/22/2019 21:48    Assessment and Plan:   1. Chest Pain -  Chest symptoms are somewhat atypical.  She had no significant CAD at cardiac catheterization in 2015.  She does have risk factors for coronary artery disease including diabetes and peripheral arterial disease.  Echocardiogram is currently pending.  If her EF remains normal without new wall motion abnormalities, consider outpatient nuclear stress test.  2. Permanent atrial fibrillation  She is status post AV node ablation with biventricular pacemaker implantation.  Rate seems well controlled.  She is ventricular pacing on telemetry.  She remains on warfarin for anticoagulation.  3. Chronic Diastolic CHF  Volume status seems stable.  Follow-up 2D echocardiogram is pending.  4. Aortic stenosis Mean gradient on echo in 7/19 was 22 mmHg.  Echo here pending.     5. S/p BiV pacemaker When seen in March 2020, she had 1.5 years left on her battery.  Will need to establish with EP as an outpatient.  6. Hypertension Blood pressure somewhat elevated.  Increase amlodipine to 5 mg daily.  7. Palpitations Etiology not entirely clear.  She seems to be maintaining good rate control on telemetry.  We can do device interrogation as an outpatient.  8.  Carotid artery disease She notes that she had 60% stenosis on 1 side when her carotid ultrasound was done 1 year ago.  Follow-up will need to be arranged as an outpatient.  She was previously on  atorvastatin when she was in [place].  This will be resumed.  9.  Left lower lobe consolidation Question if this is pneumonia.  Management per primary service.    For questions or updates, please contact [facility] Please consult [email or web address] for contact info under    Signed, Tereso NewcomerScott Weaver, PA-C  03/23/2019 11:07 AM   Personally seen and examined. Agree with above.   78 year old with AV nodal ablation, biventricular pacer dependent COPD on home oxygen prior TIA fatigue moved from [place] to be with her son who just retired after 37 years in [employer] here with sensation of palpitations, no syncope, no fevers chills nausea vomiting.  GEN: Well nourished, well developed, in no acute distress  HEENT: normal  Neck: no JVD, carotid bruits, or masses Cardiac: RRR; 2/6 systolic and diastolic murmur, no rubs, or gallops,no edema  Respiratory:  clear to auscultation bilaterally, normal work of breathing GI: soft, nontender, nondistended, + BS MS: no deformity or atrophy  Skin: warm and dry, no rash Neuro:  Alert and Oriented x 3, Strength and sensation  are intact Psych: euthymic mood, full affect  2015 cardiac catheterization with minor irregularities in coronary arteries with mild aortic stenosis at the time with a mean of 11.  Echocardiogram last year in 2019 showed EF of 60% with moderate aortic stenosis mean 22 with mild to moderate AI.  Mild to moderate MR.  In the past she has had a history of nonischemic cardiomyopathy with EF of 30 to 35% in the past.  This seems to have returned to normal after biventricular pacing.  Assessment and plan  Chest pain -Currently pain-free.  Atypical type symptoms more like palpitations.  Did describe to her that given the AV nodal ablation, rapid atrial fibrillation would not occur.  When we get her set up with electrophysiology, we will have pacemaker interrogation.  Could she have had a PVC? -It may not be a bad idea for [place] to proceed  with a nuclear stress test as an outpatient to look for any signs of high risk ischemia.  Prior cath in 2015.  Permanent atrial fibrillation with AV nodal ablation - Pacer dependent.  EP clinic. -Warfarin subtherapeutic.  Increasing dose per pharmacy.  Chronic diastolic heart failure -Stable currently.  No signs of volume overload.  Moderate aortic stenosis - Mean gradient previously 22 in 2019.  Essential hypertension - Amlodipine has been increased to 5.  Continue with other medications.  Toprol.  Carotid artery disease - Previously 60% on 1 side she states.  We will continue to monitor as outpatient.  Donato Schultz, MD

## 2019-03-23 NOTE — Evaluation (Signed)
Physical Therapy Evaluation Patient Details Name: Lauren Holland MRN: [id number] DOB: 05-13-1941 Today's Date: 03/23/2019   History of Present Illness  Pt is a 78 y/o female with PMH of chronic diastolic heart failure, chronic a fib, DM2, HTN, CKD 3, COPD, and noted recent hospitilzation in June from UTI and R foot gout flare. She presents with increased fatgiue, lightheadedness and palpitations with chest pressure.   Clinical Impression   Pt admitted with above diagnosis. Pt currently with functional limitations due to the deficits listed below (see PT Problem List). Patient admitted for above and limited by problem list below, including impaired balance, decreased activity tolerance, generalized weakness.  She reports using rollator or wc for mobility and independent with ADLs PTA, from SNF for rehab but reports eager to dc home with her sons support 24/7. Pt will benefit from skilled PT to increase their independence and safety with mobility to allow discharge to the venue listed below.       Follow Up Recommendations SNF    Equipment Recommendations  Rolling walker with 5" wheels;3in1 (PT)(I believe she may already have a rollator)    Recommendations for Other Services       Precautions / Restrictions Precautions Precautions: Fall Restrictions Weight Bearing Restrictions: No      Mobility  Bed Mobility               General bed mobility comments: OOB in recliner upon entry  Transfers Overall transfer level: Needs assistance Equipment used: Rolling walker (2 wheeled) Transfers: Sit to/from Stand Sit to Stand: Min guard         General transfer comment: min guard for safety/balance  Ambulation/Gait Ambulation/Gait assistance: Min guard Gait Distance (Feet): 100 Feet(One seated rest break) Assistive device: Rolling walker (2 wheeled)       General Gait Details: Noted heavy dependence on rW for support; required one seated rest break  Stairs             Wheelchair Mobility    Modified Rankin (Stroke Patients Only)       Balance Overall balance assessment: Needs assistance Sitting-balance support: No upper extremity supported;Feet supported Sitting balance-Leahy Scale: Good     Standing balance support: Bilateral upper extremity supported;No upper extremity supported;During functional activity Standing balance-Leahy Scale: Poor Standing balance comment: pt relaint on B UE support during mobility, able to engage in ADLs with 0-2 hand support but preference to at least 1 UE support (min guard required)                             Pertinent Vitals/Pain Pain Assessment: No/denies pain    Home Living Family/patient expects to be discharged to:: Private residence Living Arrangements: Children Available Help at Discharge: Family;Available 24 hours/day Type of Home: House Home Access: Level entry     Home Layout: One level Home Equipment: Wheelchair - [employer] - 4 wheels;Walker - 2 wheels;Bedside commode;Shower seat      Prior Function Level of Independence: Independent with assistive device(s)         Comments: reports using walker/wc for mobility, independent with ADLs      Hand Dominance   Dominant Hand: Left    Extremity/Trunk Assessment   Upper Extremity Assessment Upper Extremity Assessment: Defer to OT evaluation    Lower Extremity Assessment Lower Extremity Assessment: Generalized weakness    Cervical / Trunk Assessment Cervical / Trunk Assessment: Kyphotic  Communication   Communication: No difficulties  Cognition Arousal/Alertness: Awake/alert Behavior During Therapy: WFL for tasks assessed/performed Overall Cognitive Status: Within Functional Limits for tasks assessed                                        General Comments      Exercises     Assessment/Plan    PT Assessment Patient needs continued PT services  PT Problem List Decreased  strength;Decreased range of motion;Decreased balance;Decreased activity tolerance;Decreased mobility;Decreased coordination;Decreased knowledge of use of DME;Decreased safety awareness;Decreased knowledge of precautions       PT Treatment Interventions Gait training;Therapeutic activities;Therapeutic exercise;Functional mobility training;Balance training;Patient/family education    PT Goals (Current goals can be found in the Care Plan section)  Acute Rehab PT Goals Patient Stated Goal: to go home with my son PT Goal Formulation: With patient Time For Goal Achievement: 04/06/19 Potential to Achieve Goals: Good    Frequency Min 3X/week   Barriers to discharge   Tells me her son will help her, and she plans to dc to his house    Co-evaluation               AM-PAC PT "6 Clicks" Mobility  Outcome Measure Help needed turning from your back to your side while in a flat bed without using bedrails?: None Help needed moving from lying on your back to sitting on the side of a flat bed without using bedrails?: A Little Help needed moving to and from a bed to a chair (including a wheelchair)?: A Little Help needed standing up from a chair using your arms (e.g., wheelchair or bedside chair)?: A Little Help needed to walk in hospital room?: A Little Help needed climbing 3-5 steps with a railing? : A Little 6 Click Score: 19    End of Session Equipment Utilized During Treatment: Gait belt Activity Tolerance: Patient tolerated treatment well;Patient limited by fatigue Patient left: in chair;with call bell/phone within reach(aobut to work with OT) Nurse Communication: Mobility status PT Visit Diagnosis: Unsteadiness on feet (R26.81);Other abnormalities of gait and mobility (R26.89);Muscle weakness (generalized) (M62.81);Repeated falls (R29.6)    Time: 1337-1400 PT Time Calculation (min) (ACUTE ONLY): 23 min   Charges:   PT Evaluation $PT Eval Moderate Complexity: 1 Mod PT  Treatments $Gait Training: 8-22 mins        Van ClinesHolly Xaden Kaufman, PT  Acute Rehabilitation Services Pager [phone] Office [phone]   Levi AlandHolly H Janessa Mickle 03/23/2019, 4:29 PM

## 2019-03-23 NOTE — Progress Notes (Signed)
  ANTICOAGULATION CONSULT NOTE - Initial Consult  Pharmacy Consult for warfarin dosing  Indication: chest pain/ACS and atrial fibrillation  Allergies  Allergen Reactions  . Codeine Palpitations and Other (See Comments)    "Allergic," per MAR  . Sulfa Antibiotics Anaphylaxis    "Allergic," per MAR  . Sulfamethoxazole-Trimethoprim Anaphylaxis    "Allergic," per MAR  . Metoclopramide Other (See Comments)    Tremors and "Allergic," per MAR   . Fenofibrate Other (See Comments)    "Allergic," per [facility]    Patient Measurements: Height: 5' (152.4 cm) Weight: 168 lb 3.2 oz (76.3 kg) IBW/kg (Calculated) : 45.5 Heparin Dosing Weight: 63  Vital Signs: Temp: 97.6 F (36.4 C) (07/12 0440) Temp Source: Oral (07/12 0440) BP: 133/57 (07/12 0822) Pulse Rate: 67 (07/12 0822)  Labs: Recent Labs    03/22/19 1208 03/22/19 1328 03/23/19 0341  HGB 12.5  --  12.7  HCT 39.2  --  38.5  PLT 183  --  177  LABPROT 23.6*  --  17.9*  INR 2.1*  --  1.5*  CREATININE 0.84  --  0.80  TROPONINIHS 9 8  --     Estimated Creatinine Clearance: 54.6 mL/min (by C-G formula based on SCr of 0.8 mg/dL).   Medical History: Past Medical History:  Diagnosis Date  . Atrial fibrillation, chronic   . Chronic diastolic (congestive) heart failure ([place])   . CKD (chronic kidney disease), stage III ([place])   . COPD (chronic obstructive pulmonary disease) ([place])   . Depression   . GERD (gastroesophageal reflux disease)   . Gout   . HLD (hyperlipidemia)   . HTN (hypertension)   . LBBB (left bundle branch block)   . OSA on CPAP   . Pacemaker   . Type II diabetes mellitus with renal manifestations (HCC)     Medications:  Scheduled:  . amLODipine  2.5 mg Oral Daily  . busPIRone  10 mg Oral BID  . Chlorhexidine Gluconate Cloth  6 each Topical Q0600  . colchicine  0.6 mg Oral Daily  . fluticasone furoate-vilanterol  1 puff Inhalation Daily  . furosemide  20 mg Oral BID  . insulin aspart  0-9 Units  Subcutaneous TID WC  . metoprolol succinate  100 mg Oral Daily   And  . metoprolol succinate  50 mg Oral QHS  . montelukast  10 mg Oral QHS  . mupirocin ointment  1 application Nasal BID  . pantoprazole  20 mg Oral Daily  . potassium chloride  10 mEq Oral BID  . umeclidinium bromide  1 puff Inhalation Daily    Assessment: 38 yof presenting with palpitations and chest pain. Patient has a history of atrial fibrillation for which she was taking warfarin 5mg  PO daily. Patient hospitalized 6/27 for fall and INR on that admission was 1.4. She was restarted on warfarin 7.5mg  PO daily x2 days with a lovenox bridge then continued on home dose of warfarin 5mg  PO daily. INR today 1.5 (subtherapeutic). CBC stable.    Goal of Therapy:  INR 2-3 Monitor platelets by anticoagulation protocol: Yes   Plan:  - Warfarin 7.5 mg x1 this evening - No lovenox bridging at this time per MD - Monitor daily INR - Monitor for s/sx of bleeding  Agnes Lawrence, PharmD PGY1 Pharmacy Resident

## 2019-03-23 NOTE — Evaluation (Signed)
Occupational Therapy Evaluation Patient Details Name: Lauren RushingCora Holland MRN: [id number] DOB: [birth year] Today's Date: 03/23/2019    History of Present Illness Pt is a 78 y/o female with PMH of chronic diastolic heart failure, chronic a fib, DM2, HTN, CKD 3, COPD, and noted recent hospitilzation in June from UTI and R foot gout flare. She presents with increased fatgiue, lightheadedness and palpitations with chest pressure.    Clinical Impression   Patient admitted for above and limited by problem list below, including impaired balance, decreased activity tolerance, generalized weakness.  She reports using rollator or wc for mobility and independent with ADLs PTA, from SNF for rehab but reports eager to dc home with her sons support 24/7.  She currently requires min guard for LB ADLs, grooming standing at sink, transfers and in room mobility using RW. Discussed home safety and ADL compensatory techniques (1 handed techniques in standing) as she is unsteady without UE support.  Based on performance today, recommend continued OT services while admitted [facility] to maximize independence and safety with ADLs/mobility upon return home.     Follow Up Recommendations  Home health OT;Supervision/Assistance - 24 hour    Equipment Recommendations  None recommended by OT(pt has 3:1 and shower chair)    Recommendations for Other Services       Precautions / Restrictions Precautions Precautions: Fall Restrictions Weight Bearing Restrictions: No      Mobility Bed Mobility               General bed mobility comments: OOB in recliner upon entry  Transfers Overall transfer level: Needs assistance Equipment used: Rolling walker (2 wheeled) Transfers: Sit to/from Stand Sit to Stand: Min guard         General transfer comment: min guard for safety/balance    Balance Overall balance assessment: Needs assistance Sitting-balance support: No upper extremity  supported;Feet supported Sitting balance-Leahy Scale: Good     Standing balance support: Bilateral upper extremity supported;No upper extremity supported;During functional activity Standing balance-Leahy Scale: Poor Standing balance comment: pt relaint on B UE support during mobility, able to engage in ADLs with 0-2 hand support but preference to at least 1 UE support (min guard required)                           ADL either performed or assessed with clinical judgement   ADL Overall ADL's : Needs assistance/impaired     Grooming: Oral care;Min guard;Standing   Upper Body Bathing: Set up;Sitting   Lower Body Bathing: Min guard;Sit to/from stand   Upper Body Dressing : Supervision/safety;Set up;Sitting   Lower Body Dressing: Min guard;Sit to/from stand   Toilet Transfer: Min guard;Ambulation;RW Toilet Transfer Details (indicate cue type and reason): simulated to/from reclienr          Functional mobility during ADLs: Min guard;Rolling walker General ADL Comments: patient limited by decreased activity tolerance, impaired balance and generalized weakness; educated on ADL compensatory techniques, fall prevention and recommendations      Vision         Perception     Praxis      Pertinent Vitals/Pain Pain Assessment: No/denies pain     Hand Dominance Left   Extremity/Trunk Assessment Upper Extremity Assessment Upper Extremity Assessment: Generalized weakness   Lower Extremity Assessment Lower Extremity Assessment: Defer to PT evaluation   Cervical / Trunk Assessment Cervical / Trunk Assessment: Kyphotic   Communication Communication Communication: No  difficulties   Cognition Arousal/Alertness: Awake/alert Behavior During Therapy: WFL for tasks assessed/performed Overall Cognitive Status: Within Functional Limits for tasks assessed                                     General Comments       Exercises     Shoulder Instructions       Home Living Family/patient expects to be discharged to:: Private residence Living Arrangements: Children Available Help at Discharge: Family;Available 24 hours/day Type of Home: House Home Access: Level entry     Home Layout: One level     Bathroom Shower/Tub: Occupational psychologist: Handicapped height     Home Equipment: Wheelchair - [employer] - 4 wheels;Walker - 2 wheels;Bedside commode;Shower seat          Prior Functioning/Environment Level of Independence: Independent with assistive device(s)        Comments: reports using walker/wc for mobility, independent with ADLs         OT Problem List: Decreased strength;Decreased activity tolerance;Impaired balance (sitting and/or standing);Decreased safety awareness;Decreased knowledge of use of DME or AE;Decreased knowledge of precautions      OT Treatment/Interventions: Self-care/ADL training;Therapeutic exercise;Energy conservation;Therapeutic activities;Patient/family education;Balance training;DME and/or AE instruction    OT Goals(Current goals can be found in the care plan section) Acute Rehab OT Goals Patient Stated Goal: to go home with my son OT Goal Formulation: With patient Time For Goal Achievement: 04/06/19 Potential to Achieve Goals: Good  OT Frequency: Min 2X/week   Barriers to D/C:            Co-evaluation              AM-PAC OT "6 Clicks" Daily Activity     Outcome Measure Help from another person eating meals?: None Help from another person taking care of personal grooming?: A Little Help from another person toileting, which includes using toliet, bedpan, or urinal?: A Little Help from another person bathing (including washing, rinsing, drying)?: A Little Help from another person to put on and taking off regular upper body clothing?: None Help from another person to put on and taking off regular lower body clothing?: A Little 6 Click Score: 20   End of Session  Equipment Utilized During Treatment: Gait belt;Rolling walker  Activity Tolerance: Patient tolerated treatment well Patient left: in chair;with call bell/phone within reach  OT Visit Diagnosis: Other abnormalities of gait and mobility (R26.89);Muscle weakness (generalized) (M62.81)                Time: 4970-2637 OT Time Calculation (min): 19 min Charges:  OT General Charges $OT Visit: 1 Visit OT Evaluation $OT Eval Low Complexity: [place], OT Acute Rehabilitation Services Pager [phone] Office [phone]   Delight Stare 03/23/2019, 2:49 PM

## 2019-03-23 NOTE — Care Management Obs Status (Signed)
[place] NOTIFICATION   Patient Details  Name: Raini Tiley MRN: [id number] Date of Birth: 1940-12-02   Medicare Observation Status Notification Given:  Yes    Bartholomew Crews, RN 03/23/2019, 1:37 PM

## 2019-03-24 ENCOUNTER — Other Ambulatory Visit: Payer: Self-pay | Admitting: Cardiology

## 2019-03-24 DIAGNOSIS — R079 Chest pain, unspecified: Secondary | ICD-10-CM

## 2019-03-24 DIAGNOSIS — R072 Precordial pain: Secondary | ICD-10-CM

## 2019-03-24 DIAGNOSIS — I482 Chronic atrial fibrillation, unspecified: Secondary | ICD-10-CM | POA: Diagnosis not present

## 2019-03-24 DIAGNOSIS — E785 Hyperlipidemia, unspecified: Secondary | ICD-10-CM

## 2019-03-24 DIAGNOSIS — N183 Chronic kidney disease, stage 3 (moderate): Secondary | ICD-10-CM | POA: Diagnosis not present

## 2019-03-24 DIAGNOSIS — R002 Palpitations: Secondary | ICD-10-CM | POA: Diagnosis not present

## 2019-03-24 LAB — CBC
HCT: 40.9 % (ref 36.0–46.0)
Hemoglobin: 13.4 g/dL (ref 12.0–15.0)
MCH: 32.1 pg (ref 26.0–34.0)
MCHC: 32.8 g/dL (ref 30.0–36.0)
MCV: 98.1 fL (ref 80.0–100.0)
Platelets: 181 10*3/uL (ref 150–400)
RBC: 4.17 MIL/uL (ref 3.87–5.11)
RDW: 15.1 % (ref 11.5–15.5)
WBC: 4.9 10*3/uL (ref 4.0–10.5)
nRBC: 0 % (ref 0.0–0.2)

## 2019-03-24 LAB — BASIC METABOLIC PANEL
Anion gap: 11 (ref 5–15)
BUN: 23 mg/dL (ref 8–23)
CO2: 25 mmol/L (ref 22–32)
Calcium: 9.1 mg/dL (ref 8.9–10.3)
Chloride: 104 mmol/L (ref 98–111)
Creatinine, Ser: 0.98 mg/dL (ref 0.44–1.00)
GFR calc Af Amer: >60 mL/min (ref >60)
GFR calc non Af Amer: 56 mL/min — ABNORMAL LOW (ref >60)
Glucose, Bld: 128 mg/dL — ABNORMAL HIGH (ref 70–99)
Potassium: 3.8 mmol/L (ref 3.5–5.1)
Sodium: 140 mmol/L (ref 135–145)

## 2019-03-24 LAB — GLUCOSE, CAPILLARY
Glucose-Capillary: 122 mg/dL — ABNORMAL HIGH (ref 70–99)
Glucose-Capillary: 115 mg/dL — ABNORMAL HIGH (ref 70–99)

## 2019-03-24 LAB — PROTIME-INR
INR: 2 — ABNORMAL HIGH (ref 0.8–1.2)
Prothrombin Time: 22.2 seconds — ABNORMAL HIGH (ref 11.4–15.2)

## 2019-03-24 MED ORDER — METOPROLOL SUCCINATE ER 50 MG PO TB24: 50.0000 mg | ORAL_TABLET | ORAL | 0 refills | Status: DC

## 2019-03-24 MED ORDER — MUPIROCIN 2 % EX OINT: 1.0000 "application " | TOPICAL_OINTMENT | Freq: Two times a day (BID) | CUTANEOUS | 0 refills | Status: AC

## 2019-03-24 MED ORDER — FUROSEMIDE 20 MG PO TABS: 20.0000 mg | ORAL_TABLET | Freq: Two times a day (BID) | ORAL | 0 refills | Status: DC

## 2019-03-24 MED ORDER — AMLODIPINE BESYLATE 5 MG PO TABS: 5.0000 mg | ORAL_TABLET | Freq: Every day | ORAL | 0 refills | Status: AC

## 2019-03-24 NOTE — TOC Transition Note (Signed)
Transition of Care [facility]) - CM/SW Discharge Note   Patient Details  Name: Lauren Holland MRN: [id number] Date of Birth: 1940-11-20  [facility] CM/SW Contact:  Bethena Roys, RN Phone Number: 03/24/2019, 12:52 PM   Clinical Narrative: Occupational Therapy recommended 3n1. MD to co sign order for DME. CM called son and stated ok to order. DME to be delivered to room prior to transition home. No further needs from CM at this time.      Final next level of care: [place] Barriers to Discharge: No Barriers Identified   Patient Goals and CMS Choice Patient states their goals for this hospitalization and ongoing recovery are:: "to get home and receive therapy-then go to ALF" CMS Medicare.gov Compare Post Acute Care list provided to:: Patient Choice offered to / list presented to : Patient   Discharge Plan and Services In-house Referral: NA Discharge Planning Services: CM Consult Post Acute Care Choice: NA          DME Arranged: 3-N-1 DME Agency: [employer] Date DME Agency Contacted: 03/24/19 Time DME Agency Contacted: [id number] Representative spoke with at DME Agency: [place]: Refused SNF [place] Agency: [place] ([place]) Date [place]: 03/24/19 Time [place]: 1232 Representative spoke with at [place]: Dan    Readmission Risk Interventions No flowsheet data found.

## 2019-03-24 NOTE — Progress Notes (Signed)
Occupational Therapy Treatment Patient Details Name: Lauren Holland MRN: [id number] DOB: 1941-01-24 Today's Date: 03/24/2019    History of present illness Pt is a 78 y/o female with PMH of chronic diastolic heart failure, chronic a fib, DM2, HTN, CKD 3, COPD, and noted recent hospitilzation in June from UTI and R foot gout flare. She presents with increased fatgiue, lightheadedness and palpitations with chest pressure.    OT comments  Pt progressing well. Performed LB dressing, ambulation to bathroom, toileting and grooming at a supervision level. Educated in pacing and pursed lip breathing with exertion. Pt with urinary incontinence, difficulty making it to the bathroom fast enough. Recommending 3 in 1, pt in agreement as her son's toilets are also low, but being replaced with the higher type. CM notified.   Follow Up Recommendations  Home health OT;Supervision/Assistance - 24 hour    Equipment Recommendations  3 in 1 bedside commode    Recommendations for Other Services      Precautions / Restrictions Precautions Precautions: Fall       Mobility Bed Mobility               General bed mobility comments: OOB in recliner upon entry  Transfers Overall transfer level: Needs assistance Equipment used: Rolling walker (2 wheeled) Transfers: Sit to/from Stand Sit to Stand: Supervision         General transfer comment: supervision for safety    Balance Overall balance assessment: Needs assistance   Sitting balance-Leahy Scale: Good       Standing balance-Leahy Scale: Fair Standing balance comment: standing at sink                           ADL either performed or assessed with clinical judgement   ADL Overall ADL's : Needs assistance/impaired     Grooming: Wash/dry hands;Standing;Supervision/safety           Upper Body Dressing : Supervision/safety;Set up;Sitting   Lower Body Dressing: Set up;Sitting/lateral leans Lower Body Dressing Details  (indicate cue type and reason): slip on shoes Toilet Transfer: Supervision/safety;Ambulation;RW;BSC   Toileting- Water quality scientist and Hygiene: Supervision/safety;Sit to/from stand       Functional mobility during ADLs: Supervision/safety;Rolling walker       Vision       Perception     Praxis      Cognition Arousal/Alertness: Awake/alert Behavior During Therapy: WFL for tasks assessed/performed Overall Cognitive Status: Within Functional Limits for tasks assessed                                          Exercises     Shoulder Instructions       General Comments      Pertinent Vitals/ Pain       Pain Assessment: No/denies pain  Home Living                                          Prior Functioning/Environment              Frequency  Min 2X/week        Progress Toward Goals  OT Goals(current goals can now be found in the care plan section)  Progress towards OT goals: Progressing toward goals  Acute Rehab OT Goals Patient  Stated Goal: to go home with my son OT Goal Formulation: With patient Time For Goal Achievement: 04/06/19 Potential to Achieve Goals: Good  Plan Discharge plan remains appropriate    Co-evaluation                 AM-PAC OT "6 Clicks" Daily Activity     Outcome Measure   Help from another person eating meals?: None Help from another person taking care of personal grooming?: A Little Help from another person toileting, which includes using toliet, bedpan, or urinal?: A Little Help from another person bathing (including washing, rinsing, drying)?: A Little Help from another person to put on and taking off regular upper body clothing?: None Help from another person to put on and taking off regular lower body clothing?: None 6 Click Score: 21    End of Session Equipment Utilized During Treatment: Gait belt;Rolling walker  OT Visit Diagnosis: Other abnormalities of gait and  mobility (R26.89);Muscle weakness (generalized) (M62.81)   Activity Tolerance Patient tolerated treatment well   Patient Left in chair;with call bell/phone within reach   Nurse Communication          Time: [id number] OT Time Calculation (min): 21 min  Charges: OT General Charges $OT Visit: 1 Visit OT Treatments $Self Care/Home Management : 8-22 mins  Martie RoundJulie Anabelle Bungert, OTR/L Acute Rehabilitation Services Pager: [phone] Office: [phone]   Evern [place], [place] 03/24/2019, 1:16 PM

## 2019-03-24 NOTE — TOC Transition Note (Signed)
Transition of Care [facility]) - CM/SW Discharge Note   Patient Details  Name: Arrionna Serena MRN: [id number] Date of Birth: 01-31-1941  [facility]) CM/SW Contact:  Bethena Roys, RN Phone Number: 03/24/2019, 12:34 PM   Clinical Narrative: Pt presented for Heart Palpitations. PTA from home with support of son. Patient without PCP- son established appointment at [facility] with MD [place]. Appointment placed on AVS. Medicare.gov list provided to patient. Patient [facility], referral made to [facility] and [facility] to begin within 24-48 hours post transition home.  Son to provide transportation home. Pt has DME RW and WC. Patient's son provided with website for Medicare.gov to look at [place]. No further needs from CM at this time.     Final next level of care: [place] Barriers to Discharge: No Barriers Identified   Patient Goals and CMS Choice Patient states their goals for this hospitalization and ongoing recovery are:: "to get home and receive therapy-then go to ALF" CMS Medicare.gov Compare Post Acute Care list provided to:: Patient Choice offered to / list presented to : Patient     Discharge Plan and Services In-house Referral: NA Discharge Planning Services: CM Consult Post Acute Care Choice: NA            HH Arranged: RN, Disease Management, PT, OT [place] Agency: [place] ([facility]) Date HH Agency Contacted: 03/24/19 Time [place]: 1232 Representative spoke with at [place]: Dan   Readmission Risk Interventions No flowsheet data found.

## 2019-03-24 NOTE — Discharge Summary (Signed)
Physician Discharge Summary  Lauren Holland LKG:[id number] DOB: 03-14-1941 DOA: 03/22/2019  PCP: System, Pcp Not In  Admit date: 03/22/2019 Discharge date: 03/24/2019  Time spent: 60 minutes  Recommendations for Outpatient Follow-up:  1. Follow-up with PCP in 2 weeks.  On follow-up patient will need repeat CT chest or PET CT scan in 3 months for follow-up on 20 x 11 mm ovoid consolidation noted on chest CT.  Patient's diabetes will also need to be followed up upon. 2. Follow-up with Dr. Anne Fu cardiology. 3. Follow-up with cardiology for outpatient stress test on 04/03/2019 at 7:30 AM. 4. Follow-up with Toney Reil, nurse practitioner cardiology on 04/07/2019 at 130. 5. Patient will be called for follow-up arrangements for Coumadin clinic.   Discharge Diagnoses:  Principal Problem:   Heart palpitations Active Problems:   Chest pain   Chronic diastolic (congestive) heart failure (HCC)   Atrial fibrillation, chronic   CKD (chronic kidney disease), stage III (HCC)   COPD (chronic obstructive pulmonary disease) (HCC)   Depression   GERD (gastroesophageal reflux disease)   Gout   HLD (hyperlipidemia)   HTN (hypertension)   OSA on CPAP   Type II diabetes mellitus with renal manifestations (HCC)   Discharge Condition: Stable and improved  Diet recommendation: Heart healthy  Filed Weights   03/22/19 1642 03/23/19 0440 03/24/19 0359  Weight: 77.2 kg 76.3 kg 76.5 kg    History of present illness:  Per Dr. Ella Jubilee Aldeen Riga is a 78 y.o. female with medical history significant of recent hospitalization 06/23 to 06/27, due to right foot gout flare, and Klebsiella urinary tract infection. She also has chronic diastolic heart failure, chronic atrial fibrillation, type 2 diabetes mellitus, hypertension, chronic kidney disease stage III, COPD, and GERD.   She had been doing daily physical therapy at the skilled nursing facility, and she has noticed increased fatigue with each time of therapy  session.    On day of admission, she was sitting and after 60 minutes of receiving her oral medications, she felt lightheaded, positive palpitations and chest pressure.  The palpitations were moderate to severe intensity, no improving or worsening factors, no associated dyspnea or diaphoresis, transitory in nature.  Her vitals were checked and reported to be normal.  She talked to her son who was concerned about his mother symptoms and requested her to be transported to the emergency room.  ED Course: Patient still felt lightheaded, her vitals have been normal, troponins negative, d-dimer was elevated.  He was referred for further evaluation.   Hospital Course:  1 chest pain/palpitations On admission it was noted that patient had presented with complaints of chest pain and noted to have palpitations which are moderate to severe in intensity.  Patient on evaluation denied any ongoing chest pain and states his symptoms were more from palpitations.  BNP of 90.1.  Cardiac enzymes essentially negative.  D-dimer was elevated at 1.47.  Patient noted to have a subtherapeutic INR this morning of 1.5.  CT angiogram chest done with limited evaluation for emboli within the subsegmental vessels of the lower lobes due to respiratory motion artifact, no acute central embolus.  20 x 11 mm ovoid consolidation in the left lower lobe slightly more solid in the interim consider follow-up repeat chest CT in 3 months or follow-up PET/CT or tissue sampling.  2D echo with EF of 50 to 55%, normal right ventricular systolic function, left atrial size was severely dilated, mild to moderate mitral valve regurgitation, moderate thickening of aortic valve, aortic  valve regurgitation is mild, mild to moderate stenosis of aortic valve.  Patient was followed by cardiology throughout the hospitalization.  Continued on home regimen of Toprol-XL, Lasix, Norvasc.  Per cardiology patient will be set up for outpatient Lexiscan nuclear study for  risk stratification.  Outpatient follow-up with cardiology.  2.  OSA CPAP nightly.  3.  Type 2 diabetes mellitus Hemoglobin A1c was 7.4 on 03/05/2019.  CBG of 125 this morning.  Patient maintained on sliding scale insulin throughout the hospitalization.  Outpatient follow-up.  4.  COPD Remained stable.  Patient was maintained on home regimen of Incruse, Breo Ellipta, Singulair, Protonix.  5.  Chronic diastolic heart failure Currently euvolemic.  2D echo with EF of 50 to 55%, normal right ventricular systolic function, left atrial size was severely dilated, mild to moderate mitral valve regurgitation, moderate thickening of aortic valve, aortic valve regurgitation is mild, mild to moderate stenosis of aortic valve.  Patient was followed by cardiology throughout the hospitalization.  Patient was maintained on home regimen Toprol-XL as well as Lasix.  Outpatient follow-up with cardiology.   6.  Hypertension Norvasc dose was increased to 5 mg daily.  Patient was maintained on home regimen of Toprol-XL.    7.  Moderate aortic stenosis 2D echo with EF of 50 to 55%, normal right ventricular systolic function, left atrial size was severely dilated, mild to moderate mitral valve regurgitation, moderate thickening of aortic valve, aortic valve regurgitation is mild, mild to moderate stenosis of aortic valve.  Patient was followed by cardiology throughout the hospitalization.  Patient will benefit from follow-up echocardiograms in the future.  Outpatient follow-up with cardiology.   8.  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    Patient maintained on home regimen of Toprol-XL.  INR was subtherapeutic at 1.5 Coumadin was dosed per pharmacy by day of discharge INR was therapeutic at 2.  Patient will be set up for Coumadin clinic per cardiology.  No need for bridging with Lovenox.  Patient will also be set up to  follow-up with electrophysiology in the outpatient setting.  Outpatient follow-up.   9.  Carotid artery disease Outpatient follow-up.  Cardiology to arrange.  10.  20 x 11 mm obvious consolidation on CT chest Outpatient follow-up with repeat chest CT or PET CT in 3 months.  Doubt if patient has pneumonia.  No need for antibiotics at this time.  Outpatient follow-up.   Procedures:  2D echo 03/23/2019  Chest x-ray 03/22/2019  CT angiogram chest 03/22/2019  Consultations:  Cardiology: Dr. Anne FuSkains 03/23/2019  Discharge Exam: Vitals:   03/24/19 0359 03/24/19 0739  BP: (!) 184/80   Pulse: 66   Resp: 18   Temp: (!) 97.5 F (36.4 C)   SpO2: 93% 94%    General: NAD Cardiovascular: RRR with 2/6 systolic ejection murmur.  No JVD.  No lower extremity edema. Respiratory: Clear to auscultation bilaterally.  No wheezes, no crackles, no rhonchi.  Discharge Instructions   Discharge Instructions    Diet - low sodium heart healthy   Complete by: As directed    Increase activity slowly   Complete by: As directed      Allergies as of 03/24/2019      Reactions   Codeine Palpitations, Other (See Comments)   "Allergic," per MAR   Sulfa Antibiotics Anaphylaxis   "Allergic," per MAR   Sulfamethoxazole-trimethoprim Anaphylaxis   "Allergic," per MAR   Metoclopramide Other (See Comments)  Tremors and "Allergic," per MAR   Fenofibrate Other (See Comments)   "Allergic," per [facility]      Medication List    TAKE these medications   acetaminophen 325 MG tablet Commonly known as: TYLENOL Take 2 tablets (650 mg total) by mouth every 6 (six) hours as needed for mild pain (or Fever >/= 101). What changed: reasons to take this   amLODipine 5 MG tablet Commonly known as: NORVASC Take 1 tablet (5 mg total) by mouth daily. What changed:   medication strength  how much to take   busPIRone 10 MG tablet Commonly known as: BUSPAR Take 10 mg by mouth 2 (two) times a day.   colchicine 0.6  MG tablet Take 1 tablet (0.6 mg total) by mouth daily. What changed: when to take this   enoxaparin 80 MG/0.8ML injection Commonly known as: LOVENOX Inject 0.8 mLs (80 mg total) into the skin 2 (two) times daily for 2 days.   Fluticasone-Umeclidin-Vilant 100-62.5-25 MCG/INH Aepb Inhale 1 puff into the lungs daily.   furosemide 20 MG tablet Commonly known as: LASIX Take 1 tablet (20 mg total) by mouth 2 (two) times daily. What changed: when to take this   lansoprazole 30 MG capsule Commonly known as: PREVACID Take 30 mg by mouth daily.   Levemir FlexTouch 100 UNIT/ML Pen Generic drug: Insulin Detemir Inject 30 Units into the skin at bedtime.   metFORMIN 500 MG tablet Commonly known as: GLUCOPHAGE Take 500 mg by mouth daily.   metoprolol succinate 50 MG 24 hr tablet Commonly known as: TOPROL-XL Take 1-2 tablets (50-100 mg total) by mouth See admin instructions. Take 100 mg by mouth in the morning and 50 mg at bedtime   montelukast 10 MG tablet Commonly known as: SINGULAIR Take 10 mg by mouth at bedtime.   mupirocin ointment 2 % Commonly known as: BACTROBAN Place 1 application into the nose 2 (two) times daily for 5 days. For mupirocin (BACTROBAN) Nasal - with cotton tip swab, apply pea sized amount into each nare.  Gently press sides of nose together to spread oint. AVOID contact with eyes.   nitroGLYCERIN 0.4 MG SL tablet Commonly known as: NITROSTAT Place 1 tablet under the tongue every 5 (five) minutes x 3 doses as needed for chest pain.   potassium chloride 10 MEQ tablet Commonly known as: K-DUR Take 10 mEq by mouth 2 (two) times a day.   PRESCRIPTION MEDICATION See admin instructions. BiPAP: "Every evening and night shift"   warfarin 5 MG tablet Commonly known as: COUMADIN Take 1.5 tablets (7.5 mg total) by mouth daily. Take 7.5mg  for 2days then resume 5mg  daily for INR 2-3 What changed:   when to take this  additional instructions      Allergies   Allergen Reactions  . Codeine Palpitations and Other (See Comments)    "Allergic," per MAR  . Sulfa Antibiotics Anaphylaxis    "Allergic," per MAR  . Sulfamethoxazole-Trimethoprim Anaphylaxis    "Allergic," per MAR  . Metoclopramide Other (See Comments)    Tremors and "Allergic," per MAR   . Fenofibrate Other (See Comments)    "All[facility]," [facility]   Follow-up Information    [facility] Follow up.   Why: Someone will call to arrange a stress test. Someone will call to establish you with electrophysiology (pacemaker doctor). Contact information: [address] [place] [phone]       [facility] [address] [facility] Follow up  in 5 day(s).   Specialty: Cardiology Why: Someone will call to arrange a Coumadin Clinic visit in the next 5 days. Contact information: [address], [address] [place] [address] [phone]       Jake Bathe, MD Follow up.   Specialty: Cardiology Why: You have a stress test scheduled for 04/03/2019 at 7:30 am. Please see attached instructions. Please call the office to reschedule if you cannot make this appointment.  Contact information: [address] [place] [address] [phone]        Leone Brand, NP Follow up.   Specialties: Cardiology, Radiology Why: Cardiology hospital follow up on 04/07/2019 at 1:30. Please arrive 15 minutes early for check in.  Contact information: [address] [place] [address] [phone]            The results of significant diagnostics from this hospitalization (including imaging, microbiology, ancillary and laboratory) are listed below for reference.    Significant Diagnostic Studies: Dg Chest 2 View  Result Date: 03/22/2019 CLINICAL DATA:  Chest tightness, shortness of breath. EXAM: CHEST - 2 VIEW COMPARISON:  None. FINDINGS: Cardiac pacemaker in in  place. Cardiomediastinal silhouette is normal. Mediastinal contours appear intact. Elevation of the right hemidiaphragm. Bibasilar atelectasis versus peribronchial airspace consolidation. Low lung volumes. Osseous structures are without acute abnormality. Soft tissues are grossly normal. IMPRESSION: 1. Low lung volumes. 2. Bibasilar atelectasis versus peribronchial airspace consolidation. Electronically Signed   By: Ted Mcalpine M.D.   On: 03/22/2019 12:57   Dg Ankle Complete Right  Result Date: 03/03/2019 CLINICAL DATA:  Fall EXAM: RIGHT ANKLE - COMPLETE 3+ VIEW COMPARISON:  None. FINDINGS: There is no evidence of fracture, dislocation, or joint effusion. 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 There is no evidence of arthropathy or other focal bone abnormality. Moderate lateral malleolus soft tissue swelling. IMPRESSION: Moderate lateral soft tissue swelling without acute fracture or dislocation. Ossific fragment at the medial malleolus is age indeterminate, but likely chronic. Correlation for point tenderness recommended, as a small avulsion fracture may also have this appearance. Electronically Signed   By: Deatra Robinson M.D.   On: 03/03/2019 20:31   Ct Head Wo Contrast  Result Date: 03/04/2019 CLINICAL DATA:  Fall EXAM: CT HEAD WITHOUT CONTRAST TECHNIQUE: Contiguous axial images were obtained from the base of the skull through the vertex without intravenous contrast. COMPARISON:  None. FINDINGS: Brain: No evidence of acute infarction, hemorrhage, hydrocephalus, extra-axial collection or mass lesion/mass effect. Mild periventricular white matter hypodensity. Vascular: No hyperdense vessel or unexpected calcification. Skull: Normal. Negative for fracture or focal lesion. Sinuses/Orbits: No acute finding. Other: None. IMPRESSION: No acute intracranial pathology.  Small-vessel white matter disease. Electronically Signed   By: Lauralyn Primes M.D.   On: 03/04/2019 19:37   Ct Angio  Chest Pe W Or Wo Contrast  Result Date: 03/22/2019 CLINICAL DATA:  Palpitation with atypical chest pain EXAM: CT ANGIOGRAPHY CHEST WITH CONTRAST TECHNIQUE: Multidetector CT imaging of the chest was performed using the standard protocol during bolus administration of intravenous contrast. Multiplanar CT image reconstructions and MIPs were obtained to evaluate the vascular anatomy. CONTRAST:  75mL OMNIPAQUE IOHEXOL 350 MG/ML SOLN COMPARISON:  Chest x-ray 03/22/2019 FINDINGS: Cardiovascular: Satisfactory opacification of the pulmonary arteries to the segmental level. Respiratory motion artifact limits evaluation for emboli within the subsegmental branches of the lower lobes. No filling defect within the central or main segmental pulmonary arteries to suggest acute central  embolus. Nonaneurysmal aorta. Moderate aortic atherosclerosis. Coronary vascular calcification. Normal heart size. No pericardial effusion. Partially visualized intracardiac pacing leads. Reflux of contrast into the IVC suggesting elevated right heart pressures. Mediastinum/Nodes: Midline trachea. No thyroid mass. Subcentimeter mediastinal lymph nodes. Esophagus within normal limits. Lungs/Pleura: Mild mosaic pattern in the upper lobes, possible small airways disease. Oval nodular area of consolidation within the left lower lobe measuring 20 x 11 mm, appears slightly more solid in the interim. Negative for pneumothorax or pleural effusion Upper Abdomen: No acute abnormality. Musculoskeletal: Degenerative changes. No acute or suspicious abnormality. Review of the MIP images confirms the above findings. IMPRESSION: 1. Limited evaluation for emboli within the subsegmental vessels of the lower lobes due to respiratory motion artifact. No acute central embolus. 2. 20 x 11 mm ovoid consolidation in the left lower lobe, slightly more solid in the interim. Consider one of the following in 3 months for both low-risk and high-risk individuals: (a) repeat  chest CT, (b) follow-up PET-CT, or (c) tissue sampling. This recommendation follows the consensus statement: Guidelines for Management of Incidental Pulmonary Nodules Detected on CT Images: From the Fleischner Society 2017; Radiology 2017; 284:228-243. 3. Reflux of contrast into the hepatic veins suggesting elevated right heart pressures. Aortic Atherosclerosis (ICD10-I70.0). Electronically Signed   By: Donavan Foil M.D.   On: 03/22/2019 21:48   Ct Abdomen Pelvis W Contrast  Result Date: 03/05/2019 CLINICAL DATA:  Abdominal trauma. EXAM: CT ABDOMEN AND PELVIS WITH CONTRAST TECHNIQUE: Multidetector CT imaging of the abdomen and pelvis was performed using the standard protocol following bolus administration of intravenous contrast. CONTRAST:  59mL OMNIPAQUE IOHEXOL 300 MG/ML  SOLN COMPARISON:  None. FINDINGS: Lower chest: There is a 1.8 cm airspace opacity in the left lower lobe. Hepatobiliary: No focal liver abnormality is seen. There is underlying hepatic steatosis. No gallstones, gallbladder wall thickening, or biliary dilatation. Pancreas: Unremarkable. No pancreatic ductal dilatation or surrounding inflammatory changes. Spleen: Normal in size without focal abnormality. Adrenals/Urinary Tract: Adrenal glands are unremarkable. Kidneys are normal, without renal calculi, focal lesion, or hydronephrosis. Bladder is unremarkable. Stomach/Bowel: Stomach is within normal limits. Appendix appears normal. No evidence of bowel wall thickening, distention, or inflammatory changes. Vascular/Lymphatic: Aortic atherosclerosis. No enlarged abdominal or pelvic lymph nodes. Reproductive: Status post hysterectomy. No adnexal masses. Other: There is some subcutaneous fat stranding along the low anterior abdominal wall which may represent sequela injections in this location. Alternatively, this could represent sequela of trauma. Musculoskeletal: Extensive lumbar fusion hardware is noted. The left L2 pedicle screw appears to be  fractured. IMPRESSION: 1. No acute intra-abdominal abnormality. 2. A 1.8 cm airspace opacity in the left lower lobe is suspicious for a developing infiltrate. A three-month follow-up CT of the chest is recommended to confirm resolution of this finding. 3. Hepatic steatosis. Electronically Signed   By: Constance Holster M.D.   On: 03/05/2019 01:27    Microbiology: Recent Results (from the past 240 hour(s))  SARS Coronavirus 2 (CEPHEID - Performed in [place] hospital lab), Hosp Order     Status: None   Collection Time: 03/22/19  4:01 PM   Specimen: Nasopharyngeal Swab  Result Value Ref Range Status   SARS Coronavirus 2 NEGATIVE NEGATIVE Final    Comment: (NOTE) If result is NEGATIVE SARS-CoV-2 target nucleic acids are NOT DETECTED. The SARS-CoV-2 RNA is generally detectable in upper and lower  respiratory specimens during the acute phase of infection. The lowest  concentration of SARS-CoV-2 viral copies this assay can detect is 250  copies / mL. A negative result does not preclude SARS-CoV-2 infection  and should not be used as the sole basis for treatment or other  patient management decisions.  A negative result may occur with  improper specimen collection / handling, submission of specimen other  than nasopharyngeal swab, presence of viral mutation(s) within the  areas targeted by this assay, and inadequate number of viral copies  (<250 copies / mL). A negative result must be combined with clinical  observations, patient history, and epidemiological information. If result is POSITIVE SARS-CoV-2 target nucleic acids are DETECTED. The SARS-CoV-2 RNA is generally detectable in upper and lower  respiratory specimens dur ing the acute phase of infection.  Positive  results are indicative of active infection with SARS-CoV-2.  Clinical  correlation with patient history and other diagnostic information is  necessary to determine patient infection status.  Positive results do  not rule  out bacterial infection or co-infection with other viruses. If result is PRESUMPTIVE POSTIVE SARS-CoV-2 nucleic acids MAY BE PRESENT.   A presumptive positive result was obtained on the submitted specimen  and confirmed on repeat testing.  While 2019 novel coronavirus  (SARS-CoV-2) nucleic acids may be present in the submitted sample  additional confirmatory testing may be necessary for epidemiological  and / or clinical management purposes  to differentiate between  SARS-CoV-2 and other Sarbecovirus currently known to infect humans.  If clinically indicated additional testing with an alternate test  methodology [id number]) is advised. The SARS-CoV-2 RNA is generally  detectable in upper and lower respiratory sp ecimens during the acute  phase of infection. The expected result is Negative. Fact Sheet for Patients:  BoilerBrush.com.cy Fact Sheet for Healthcare Providers: https://pope.com/ This test is not yet approved or cleared by the [place] FDA and has been authorized for detection and/or diagnosis of SARS-CoV-2 by FDA under an Emergency Use Authorization (EUA).  This EUA will remain in effect (meaning this test can be used) for the duration of the COVID-19 declaration under Section 564(b)(1) of the Act, 21 U.S.C. section 360bbb-3(b)(1), unless the authorization is terminated or revoked sooner. Performed at [facility], [address] [place], [place] [address]   MRSA PCR Screening     Status: Abnormal   Collection Time: 03/22/19  7:29 PM   Specimen: Nasal Mucosa; Nasopharyngeal  Result Value Ref Range Status   MRSA by PCR POSITIVE (A) NEGATIVE Final    Comment:        The GeneXpert MRSA Assay (FDA approved for NASAL specimens only), is one component of a comprehensive MRSA colonization surveillance program. It is not intended to diagnose MRSA infection nor to guide or monitor treatment for MRSA infections. RESULT  CALLED TO, READ BACK BY AND VERIFIED WITH: HELUM,M RN [id number] 03/22/2019 MITCHELL,L Performed at [facility], [address], [place], [place] [address]      Labs: Basic Metabolic Panel: Recent Labs  Lab 03/22/19 1208 03/23/19 0341 03/24/19 0528  NA 141 141 140  K 3.7 3.9 3.8  CL 105 105 104  CO2 GLUCOSE 99 98 128*  BUN CREATININE 0.84 0.80 0.98  CALCIUM 9.4 9.1 9.1   Liver Function Tests: No results for input(s): AST, ALT, ALKPHOS, BILITOT, PROT, ALBUMIN in the last 168 hours. No results for input(s): LIPASE, AMYLASE in the last 168 hours. No results for input(s): AMMONIA in the last 168 hours. CBC: Recent Labs  Lab 03/22/19 1208 03/23/19 0341 03/24/19  0528  WBC 6.5 6.0 4.9  HGB 12.5 12.7 13.4  HCT 39.2 38.5 40.9  MCV 101.3* 98.2 98.1  PLT 183 177 181   Cardiac Enzymes: No results for input(s): CKTOTAL, CKMB, CKMBINDEX, TROPONINI in the last 168 hours. BNP: BNP (last 3 results) Recent Labs    03/05/19 0434 03/22/19 1208  BNP 38.6 90.1    ProBNP (last 3 results) No results for input(s): PROBNP in the last 8760 hours.  CBG: Recent Labs  Lab 03/23/19 0746 03/23/19 1151 03/23/19 1656 03/23/19 2111 03/24/19 0744  GLUCAP 125* 118* 104* 125* 122*       Signed:  Ramiro Harvestaniel Jatinder Mcdonagh MD.  [facility] 03/24/2019, 11:06 AM

## 2019-03-24 NOTE — Discharge Instructions (Signed)
° °  You have a Stress Test scheduled at [facility]. Your doctor has ordered this test to check the blood flow in your heart arteries. ° °Please arrive 15 minutes early for paperwork. The whole test will take several hours. You may want to bring reading material to remain occupied while undergoing different parts of the test. ° °Instructions: °· No food/drink after midnight the night before. °· It is OK to take your morning meds with a sip of water EXCEPT for those types of medicines listed below or otherwise instructed. °· No caffeine/decaf products 24 hours before, including medicines such as Excedrin or Goody Powders. Call if there are any questions.  °· Wear comfortable clothes and shoes.  ° °Special Medication Instructions: °· Beta blockers such as metoprolol (Lopressor/Toprol XL), atenolol (Tenormin), carvedilol (Coreg), nebivolol (Bystolic), bisoprolol (Zebeta), propranolol (Inderal) should not be taken for 24 hours before the test. °· Calcium channel blockers such as diltiazem (Cardizem) or verapmil (Calan) should not be taken for 24 hours before the test. °· Remove nitroglycerin patches and do not take nitrate preparations such as Imdur/isosorbide the day of your test. °· No Persantine/Theophylline or Aggrenox medicines should be used within 24 hours of the test.  °· If you are diabetic, please ask which medications to hold the day of the test ° °What To Expect: °When you arrive in the lab, the technician will inject a small amount of radioactive tracer into your arm through an IV while you are resting quietly. This helps us to form pictures of your heart. You will likely only feel a sting from the IV. After a waiting period, resting pictures will be obtained under a big camera. These are the "before" pictures. ° °Next, you will be prepped for the stress portion of the test. This may include either walking on a treadmill or receiving a medicine that helps to dilate blood vessels in  your heart to simulate the effect of exercise on your heart. If you are walking on a treadmill, you will walk at different paces to try to get your heart rate to a goal number that is based on your age. If your doctor has chosen the pharmacologic test, then you will receive a medicine through your IV that may cause temporary nausea, flushing, shortness of breath and sometimes chest discomfort or vomiting. This is typically short-lived and usually resolves quickly. If you experience symptoms, that does not automatically mean the test is abnormal. Some patients do not experience any symptoms at all. Your blood pressure and heart rate will be monitored, and we will be watching your EKG on a computer screen for any changes. During this portion of the test, the radiologist will inject another small amount of radioactive tracer into your IV. After a waiting period, you will undergo a second set of pictures. These are the "after" pictures. ° °The doctor reading the test will compare the before-and-after images to look for evidence of heart blockages or heart weakness. The test usually takes 1 day to complete, but in certain instances (for example, if a patient is over a certain weight limit), the test may be done over the span of 2 days. ° ° °

## 2019-03-24 NOTE — Progress Notes (Signed)
Progress Note  Patient Name: Lauren RushingCora Holland Date of Encounter: 03/24/2019  Primary Cardiologist: Dr Anne FuSkains   Subjective   No CP or dyspnea  Inpatient Medications    Scheduled Meds:  amLODipine  5 mg Oral Daily   busPIRone  10 mg Oral BID   Chlorhexidine Gluconate Cloth  6 each Topical Q0600   colchicine  0.6 mg Oral Daily   fluticasone furoate-vilanterol  1 puff Inhalation Daily   furosemide  20 mg Oral BID   insulin aspart  0-9 Units Subcutaneous TID WC   metoprolol succinate  100 mg Oral Daily   And   metoprolol succinate  50 mg Oral QHS   montelukast  10 mg Oral QHS   mupirocin ointment  1 application Nasal BID   pantoprazole  20 mg Oral Daily   potassium chloride  10 mEq Oral BID   umeclidinium bromide  1 puff Inhalation Daily   Warfarin - Pharmacist Dosing Inpatient   Does not apply q1800   Continuous Infusions:  PRN Meds: acetaminophen, nitroGLYCERIN, ondansetron **OR** ondansetron (ZOFRAN) IV   Vital Signs    Vitals:   03/23/19 2107 03/23/19 2320 03/24/19 0359 03/24/19 0739  BP: (!) 168/77  (!) 184/80   Pulse: 87 64 66   Resp: 16 18 18    Temp: 98.8 F (37.1 C)  (!) 97.5 F (36.4 C)   TempSrc: Oral  Oral   SpO2: 92% 95% 93% 94%  Weight:   76.5 kg   Height:        Intake/Output Summary (Last 24 hours) at 03/24/2019 1011 Last data filed at 03/24/2019 0400 Gross per 24 hour  Intake 1080 ml  Output 1300 ml  Net -220 ml   Last 3 Weights 03/24/2019 03/23/2019 03/22/2019  Weight (lbs) 168 lb 11.2 oz 168 lb 3.2 oz 170 lb 1.6 oz  Weight (kg) 76.522 kg 76.295 kg 77.157 kg      Telemetry    Ventricular pacing with underlying atrial fibrillation.- Personally Reviewed   Physical Exam   GEN: No acute distress.   Neck: No JVD Cardiac: RRR, 2/6 systolic murmur left sternal border. Respiratory:  Diminished breath sounds throughout GI: Soft, nontender, non-distended  MS: No edema Neuro:  Nonfocal  Psych: Normal affect   Labs    High  Sensitivity Troponin:   Recent Labs  Lab 03/22/19 1208 03/22/19 1328  TROPONINIHS 9 8      Chemistry Recent Labs  Lab 03/22/19 1208 03/23/19 0341 03/24/19 0528  NA 141 141 140  K 3.7 3.9 3.8  CL 105 105 104  CO2 25 24 25   GLUCOSE 99 98 128*  BUN 15 15 23   CREATININE 0.84 0.80 0.98  CALCIUM 9.4 9.1 9.1  GFRNONAA >60 >60 56*  GFRAA >60 >60 >60  ANIONGAP 11 12 11      Hematology Recent Labs  Lab 03/22/19 1208 03/23/19 0341 03/24/19 0528  WBC 6.5 6.0 4.9  RBC 3.87 3.92 4.17  HGB 12.5 12.7 13.4  HCT 39.2 38.5 40.9  MCV 101.3* 98.2 98.1  MCH 32.3 32.4 32.1  MCHC 31.9 33.0 32.8  RDW 15.3 15.3 15.1  PLT 183 177 181    BNP Recent Labs  Lab 03/22/19 1208  BNP 90.1     DDimer  Recent Labs  Lab 03/22/19 1208  DDIMER 1.47*     Radiology    Dg Chest 2 View  Result Date: 03/22/2019 CLINICAL DATA:  Chest tightness, shortness of breath. EXAM: CHEST - 2 VIEW COMPARISON:  None. FINDINGS: Cardiac pacemaker in in place. Cardiomediastinal silhouette is normal. Mediastinal contours appear intact. Elevation of the right hemidiaphragm. Bibasilar atelectasis versus peribronchial airspace consolidation. Low lung volumes. Osseous structures are without acute abnormality. Soft tissues are grossly normal. IMPRESSION: 1. Low lung volumes. 2. Bibasilar atelectasis versus peribronchial airspace consolidation. Electronically Signed   By: Ted Mcalpineobrinka  Dimitrova M.D.   On: 03/22/2019 12:57   Ct Angio Chest Pe W Or Wo Contrast  Result Date: 03/22/2019 CLINICAL DATA:  Palpitation with atypical chest pain EXAM: CT ANGIOGRAPHY CHEST WITH CONTRAST TECHNIQUE: Multidetector CT imaging of the chest was performed using the standard protocol during bolus administration of intravenous contrast. Multiplanar CT image reconstructions and MIPs were obtained to evaluate the vascular anatomy. CONTRAST:  75mL OMNIPAQUE IOHEXOL 350 MG/ML SOLN COMPARISON:  Chest x-ray 03/22/2019 FINDINGS: Cardiovascular:  Satisfactory opacification of the pulmonary arteries to the segmental level. Respiratory motion artifact limits evaluation for emboli within the subsegmental branches of the lower lobes. No filling defect within the central or main segmental pulmonary arteries to suggest acute central embolus. Nonaneurysmal aorta. Moderate aortic atherosclerosis. Coronary vascular calcification. Normal heart size. No pericardial effusion. Partially visualized intracardiac pacing leads. Reflux of contrast into the IVC suggesting elevated right heart pressures. Mediastinum/Nodes: Midline trachea. No thyroid mass. Subcentimeter mediastinal lymph nodes. Esophagus within normal limits. Lungs/Pleura: Mild mosaic pattern in the upper lobes, possible small airways disease. Oval nodular area of consolidation within the left lower lobe measuring 20 x 11 mm, appears slightly more solid in the interim. Negative for pneumothorax or pleural effusion Upper Abdomen: No acute abnormality. Musculoskeletal: Degenerative changes. No acute or suspicious abnormality. Review of the MIP images confirms the above findings. IMPRESSION: 1. Limited evaluation for emboli within the subsegmental vessels of the lower lobes due to respiratory motion artifact. No acute central embolus. 2. 20 x 11 mm ovoid consolidation in the left lower lobe, slightly more solid in the interim. Consider one of the following in 3 months for both low-risk and high-risk individuals: (a) repeat chest CT, (b) follow-up PET-CT, or (c) tissue sampling. This recommendation follows the consensus statement: Guidelines for Management of Incidental Pulmonary Nodules Detected on CT Images: From the Fleischner Society 2017; Radiology 2017; 284:228-243. 3. Reflux of contrast into the hepatic veins suggesting elevated right heart pressures. Aortic Atherosclerosis (ICD10-I70.0). Electronically Signed   By: Jasmine PangKim  Fujinaga M.D.   On: 03/22/2019 21:48    Patient Profile     Lauren Holland is a 78  y.o. female with a hx of non-ischemic cardiomyopathy with recovered EF, diastolic CHF, permanent AFib, s/p AV node ablation and BiV pacer, aortic stenosis, COPD on home O2, diabetes mellitus, chronic kidney disease, prior TIA, PAD who is being seen today for the evaluation of chest pain.  Echocardiogram shows ejection fraction 50 to 55%, mild to moderate mitral regurgitation, mild aortic insufficiency, mild to moderate aortic stenosis.  Assessment & Plan    1 chest pain-patient has ruled out.  Plan will be outpatient Lexiscan nuclear study for risk stratification.  2 mild to moderate mitral regurgitation/mild to moderate aortic stenosis/mild aortic insufficiency-she will need follow-up echocardiograms in the future.  3 permanent atrial fibrillation-continue Coumadin.  4 prior pacemaker-patient will need follow-up with electrophysiology to establish following discharge.  5 Pulmonary consolidation on CT  6 hypertension-blood pressure remains elevated.  Amlodipine was increased yesterday.  Follow blood pressure and adjust regimen as needed.  7 history of carotid artery disease-arrange follow-up carotid Dopplers after discharge.  Cardiology will sign off.  Please call with questions.  We will make arrangements for Lexiscan nuclear study and then follow-up with Dr. Marlou Porch.  We will arrange follow-up with electrophysiology.  For questions or updates, please contact [place] Please consult [email or web address] for contact info under        Signed, Kirk Ruths, MD  03/24/2019, 10:11 AM

## 2019-03-31 ENCOUNTER — Other Ambulatory Visit: Payer: Self-pay

## 2019-03-31 ENCOUNTER — Telehealth (HOSPITAL_COMMUNITY): Payer: Self-pay

## 2019-03-31 ENCOUNTER — Ambulatory Visit (INDEPENDENT_AMBULATORY_CARE_PROVIDER_SITE_OTHER): Payer: Medicare Other | Admitting: *Deleted

## 2019-03-31 DIAGNOSIS — Z5181 Encounter for therapeutic drug level monitoring: Secondary | ICD-10-CM | POA: Insufficient documentation

## 2019-03-31 DIAGNOSIS — Z7901 Long term (current) use of anticoagulants: Secondary | ICD-10-CM | POA: Diagnosis not present

## 2019-03-31 DIAGNOSIS — I482 Chronic atrial fibrillation, unspecified: Secondary | ICD-10-CM

## 2019-03-31 LAB — POCT INR: INR: 4.7 — AB (ref 2.0–3.0)

## 2019-03-31 NOTE — Patient Instructions (Addendum)
A full discussion of the nature of anticoagulants has been carried out.  A benefit risk analysis has been presented to the patient, so that they understand the justification for choosing anticoagulation at this time. The need for frequent and regular monitoring, precise dosage adjustment and compliance is stressed.  Side effects of potential bleeding are discussed.  The patient should avoid any OTC items containing aspirin or ibuprofen, and should avoid great swings in general diet.  Avoid alcohol consumption.  Call if any signs of abnormal bleeding.   Description   Do not take any Coumadin today and No Coumadin tomorrow then resume taking 1 tablet daily. Recheck INR in 1 week. [facility] [phone] Main [phone]

## 2019-03-31 NOTE — Telephone Encounter (Signed)
Spoke with the patient, giving her instructions. She stated that she understood and would be here. Asked to call back with any questions. S.Lauren Holland EMTP

## 2019-04-03 ENCOUNTER — Encounter (HOSPITAL_COMMUNITY): Payer: Medicare Other

## 2019-04-03 ENCOUNTER — Ambulatory Visit (HOSPITAL_COMMUNITY): Payer: Medicare Other | Attending: Cardiology

## 2019-04-03 ENCOUNTER — Telehealth: Payer: Self-pay | Admitting: *Deleted

## 2019-04-03 ENCOUNTER — Other Ambulatory Visit: Payer: Self-pay

## 2019-04-03 DIAGNOSIS — R0789 Other chest pain: Secondary | ICD-10-CM | POA: Insufficient documentation

## 2019-04-03 DIAGNOSIS — R079 Chest pain, unspecified: Secondary | ICD-10-CM

## 2019-04-03 LAB — MYOCARDIAL PERFUSION IMAGING
LV dias vol: 101 mL (ref 46–106)
LV sys vol: 56 mL
Peak HR: 63 {beats}/min
Rest HR: 68 {beats}/min
SDS: 2
SRS: 1
SSS: 3
TID: 0.98

## 2019-04-03 MED ORDER — TECHNETIUM TC 99M TETROFOSMIN IV KIT
10.9000 | PACK | Freq: Once | INTRAVENOUS | Status: AC | PRN
  Administered 2019-04-03: 10.9 via INTRAVENOUS
  Filled 2019-04-03: qty 11

## 2019-04-03 MED ORDER — TECHNETIUM TC 99M TETROFOSMIN IV KIT
32.4000 | PACK | Freq: Once | INTRAVENOUS | Status: AC | PRN
  Administered 2019-04-03: 32.4 via INTRAVENOUS
  Filled 2019-04-03: qty 33

## 2019-04-03 MED ORDER — REGADENOSON 0.4 MG/5ML IV SOLN
0.4000 mg | Freq: Once | INTRAVENOUS | Status: AC
  Administered 2019-04-03: 0.4 mg via INTRAVENOUS

## 2019-04-03 NOTE — Telephone Encounter (Signed)

## 2019-04-04 ENCOUNTER — Ambulatory Visit (HOSPITAL_COMMUNITY): Payer: Medicare Other

## 2019-04-04 ENCOUNTER — Telehealth: Payer: Self-pay | Admitting: *Deleted

## 2019-04-04 NOTE — Telephone Encounter (Signed)
Called pt re: appt 04/07/2019, pt has answered no to all covid19 prescreening questions below.       COVID-19 Pre-Screening Questions:  . In the past 7 to 10 days have you had a cough,  shortness of breath, headache, congestion, fever (100 or greater) body aches, chills, sore throat, or sudden loss of taste or sense of smell? . Have you been around anyone with known Covid 19. . Have you been around anyone who is awaiting Covid 19 test results in the past 7 to 10 days? . Have you been around anyone who has been exposed to Covid 19, or has mentioned symptoms of Covid 19 within the past 7 to 10 days?  If you have any concerns/questions about symptoms patients report during screening (either on the phone or at threshold). Contact the provider seeing the patient or DOD for further guidance.  If neither are available contact a member of the leadership team.

## 2019-04-07 ENCOUNTER — Other Ambulatory Visit: Payer: Self-pay

## 2019-04-07 ENCOUNTER — Ambulatory Visit (INDEPENDENT_AMBULATORY_CARE_PROVIDER_SITE_OTHER): Payer: Medicare Other | Admitting: Cardiology

## 2019-04-07 ENCOUNTER — Encounter: Payer: Self-pay | Admitting: Cardiology

## 2019-04-07 ENCOUNTER — Ambulatory Visit (INDEPENDENT_AMBULATORY_CARE_PROVIDER_SITE_OTHER): Payer: Medicare Other | Admitting: *Deleted

## 2019-04-07 VITALS — BP 120/64 | HR 70 | Ht 60.0 in | Wt 170.8 lb

## 2019-04-07 DIAGNOSIS — Z9989 Dependence on other enabling machines and devices: Secondary | ICD-10-CM

## 2019-04-07 DIAGNOSIS — I5032 Chronic diastolic (congestive) heart failure: Secondary | ICD-10-CM

## 2019-04-07 DIAGNOSIS — Z7901 Long term (current) use of anticoagulants: Secondary | ICD-10-CM | POA: Diagnosis not present

## 2019-04-07 DIAGNOSIS — R0789 Other chest pain: Secondary | ICD-10-CM

## 2019-04-07 DIAGNOSIS — Z5181 Encounter for therapeutic drug level monitoring: Secondary | ICD-10-CM

## 2019-04-07 DIAGNOSIS — I4821 Permanent atrial fibrillation: Secondary | ICD-10-CM

## 2019-04-07 DIAGNOSIS — I482 Chronic atrial fibrillation, unspecified: Secondary | ICD-10-CM

## 2019-04-07 DIAGNOSIS — I10 Essential (primary) hypertension: Secondary | ICD-10-CM

## 2019-04-07 DIAGNOSIS — J449 Chronic obstructive pulmonary disease, unspecified: Secondary | ICD-10-CM | POA: Diagnosis not present

## 2019-04-07 DIAGNOSIS — R079 Chest pain, unspecified: Secondary | ICD-10-CM

## 2019-04-07 DIAGNOSIS — M109 Gout, unspecified: Secondary | ICD-10-CM | POA: Diagnosis not present

## 2019-04-07 DIAGNOSIS — G4733 Obstructive sleep apnea (adult) (pediatric): Secondary | ICD-10-CM

## 2019-04-07 DIAGNOSIS — Z950 Presence of cardiac pacemaker: Secondary | ICD-10-CM

## 2019-04-07 LAB — POCT INR: INR: 2.7 (ref 2.0–3.0)

## 2019-04-07 MED ORDER — COLCHICINE 0.6 MG PO TABS: 0.6000 mg | ORAL_TABLET | Freq: Every day | ORAL | 0 refills | Status: DC

## 2019-04-07 MED ORDER — METOPROLOL SUCCINATE ER 50 MG PO TB24: 50.0000 mg | ORAL_TABLET | ORAL | 3 refills | Status: AC

## 2019-04-07 MED ORDER — GABAPENTIN 300 MG PO CAPS: 300.0000 mg | ORAL_CAPSULE | Freq: Three times a day (TID) | ORAL | 0 refills | Status: AC

## 2019-04-07 MED ORDER — FUROSEMIDE 20 MG PO TABS: ORAL_TABLET | ORAL | 3 refills | Status: DC

## 2019-04-07 MED ORDER — GABAPENTIN 300 MG PO CAPS: 300.0000 mg | ORAL_CAPSULE | Freq: Three times a day (TID) | ORAL | 0 refills | Status: DC

## 2019-04-07 MED ORDER — COLCHICINE 0.6 MG PO TABS: 0.6000 mg | ORAL_TABLET | Freq: Every day | ORAL | 0 refills | Status: AC

## 2019-04-07 NOTE — Patient Instructions (Addendum)
Medication Instructions:  Your physician has recommended you make the following change in your medication:   1. DECREASE LASIX TO 1 TABLET (20 MG) DAILY AND 2 TABLETS (40 MG) EVERY OTHER DAY.  If you need a refill on your cardiac medications before your next appointment, please call your pharmacy.   Lab work: TODAY: BMET, URIC ACID If you have labs (blood work) drawn today and your tests are completely normal, you will receive your results only by: Marland Kitchen MyChart Message (if you have MyChart) OR . A paper copy in the mail If you have any lab test that is abnormal or we need to change your treatment, we will call you to review the results.  Testing/Procedures: NONE  Follow-Up: At [facility], you and your health needs are our priority.  As part of our continuing mission to provide you with exceptional heart care, we have created designated Provider Care Teams.  These Care Teams include your primary Cardiologist (physician) and Advanced Practice Providers (APPs -  Physician Assistants and Nurse Practitioners) who all work together to provide you with the care you need, when you need it. You will need a follow up appointment in 2-3  months.  Please call our office 2 months in advance to schedule this appointment.  You may see DR. SKAINS or one of the following Advanced Practice Providers on your designated Care Team:   Truitt Merle, NP Cecilie Kicks, NP  . Kathyrn Drown, NP  YOU HAVE BEEN REFERRED TO SEE PULMONOLOGY. THEY WILL CALL YOU WITH A SCHEDULE APPOINTMENT    Any Other Special Instructions Will Be Listed Below (If Applicable) Please make sure to weigh daily and if your weight increases 3 lbs or more in 1 day or 5lbs in one week, call our office to let us know [phone].

## 2019-04-07 NOTE — Progress Notes (Signed)
Cardiology Office Note   Date:  04/07/2019   ID:  Lauren RushingCora Holland, DOB [birth year], MRN [id number]  PCP:  Gaspar Garbeisovec, Richard W, MD  Cardiologist:  Dr.Skains     Chief Complaint  Patient presents with  . Cardiomyopathy  . Hospitalization Follow-up      History of Present Illness: Lauren RushingCora Holland is a 78 y.o. female who presents for post hospitalization.    She has a hx of non-ischemic cardiomyopathy with recovered EF, diastolic CHF, permanent AFib, s/p AV node ablation and BiV pacer, aortic stenosis, COPD on home O2, diabetes mellitus, chronic kidney disease, prior TIA, PAD   She has a hx of permanent atrial fibrillation and underwent AV nodal ablation with placement of a BiV pacemaker in 2013. This is in [place] see care everywhere MDT device.  Last interrogated 11/29/18.  She also has a hx of non-ischemic cardiomyopathy with EF 30-35% in the past.  Her EF has improved to normal and she does have chronic diastolic CHF.  Records indicate she previously had an ICD that was explanted in 2010 due to infection.  then BIV in 2013.   Echo in 03/13/18 [facility]([place]): EF 55-60, mod AS (mean 22), mild to mod AI, mild to mod MR Cardiac Catheterization in 10/2013 [facility]([place]): irregularities in the LM and LAD, mild AS with mean 11 mmHg. EF 55. ABIs 04/2012: R 0.65, L 0.61  She was recently admitted with gout and a UTI and DC to SNF.  She developed palpitations, lightheadedness and chest pain at the SNF and was readmitted to [facility].   Echocardiogram showed ejection fraction 50 to 55%, mild to moderate mitral regurgitation, mild aortic insufficiency, mild to moderate aortic stenosis.  Neg troponin, planned for outpt lexiscan. - eval by EP with Dr. Elberta Fortisamnitz for her PPM and hx AV Node ablation.  HTN with increase of amlodipine.  Hx of carotid disease.    Her nuc study was done Ef 45% and defect small of mild severity  Small size, moderate severity fixed basal to mid inferior perfusion defect, suspect  attenuation artifact or pacer-related. LVEF 45% with mild inferior hypokinesis. This is an intermediate risk study.  Reviewed with Dr. Anne FuSkains and no cath unless further pain.    Today she is living with her son and mostly doing well.  BP is stable.  She is aware of tachycardia with increased stress.  This is not that freq.  No chest pain.  Her SOB is chronic and at baseline.  Her sone with her mentions that her tricuspid valve needed surgery but she was too ill to have done.  Mild TR on echo and other valves are not severe enough for intervention.  She is to see virtual visit with Dr. Wylene Simmerisovec.  She would also like pulmonary consult for her COPD.  On Her CTA of chest 20 x 11 mm ovoid consolidation in the left lower lobe, slightly more solid in the interim.  Pt with gout and still with episodes despite colchicine - she would like to come off lasix - not sure that is wise but will decrease to 40 mg alt with 20 mg QOD.  She will call if wt increases 3 lbs in a day or 5 lbs in a week.   She needs refill on gabapentin which I will do for 1 month until she is seen by PCP.  She does have PT/OT coming to her home.    She needs coumadin clinic today.  Past Medical History:  Diagnosis Date  .  Atrial fibrillation, chronic   . Chronic diastolic (congestive) heart failure (HCC)   . CKD (chronic kidney disease), stage III (HCC)   . COPD (chronic obstructive pulmonary disease) (HCC)   . Depression   . GERD (gastroesophageal reflux disease)   . Gout   . HLD (hyperlipidemia)   . HTN (hypertension)   . LBBB (left bundle branch block)   . OSA on CPAP   . Pacemaker   . Type II diabetes mellitus with renal manifestations [facility](HCC)     Past Surgical History:  Procedure Laterality Date  . PACEMAKER PLACEMENT       Current Outpatient Medications  Medication Sig Dispense Refill  . acetaminophen (TYLENOL) 325 MG tablet Take 2 tablets (650 mg total) by mouth every 6 (six) hours as needed for mild pain (or Fever >/=  101).    Marland Kitchen. amLODipine (NORVASC) 5 MG tablet Take 1 tablet (5 mg total) by mouth daily. 30 tablet 0  . busPIRone (BUSPAR) 10 MG tablet Take 10 mg by mouth 2 (two) times a day.    . colchicine 0.6 MG tablet Take 1 tablet (0.6 mg total) by mouth daily. PLEASE FOLLOW UP WITH PRIMARY CARE DOCTOR FOR ANY FUTURE REFILLS. 30 tablet 0  . Fluticasone-Umeclidin-Vilant 100-62.5-25 MCG/INH AEPB Inhale 1 puff into the lungs daily.    Marland Kitchen. gabapentin (NEURONTIN) 300 MG capsule Take 1 capsule (300 mg total) by mouth 3 (three) times daily. PLEASE FOLLOW UP WITH YOU PRIMARY CARE DOCTOR FOR ANY FUTURE REFILLS. 30 capsule 0  . Insulin Detemir (LEVEMIR FLEXTOUCH) 100 UNIT/ML Pen Inject 30 Units into the skin at bedtime.    . lansoprazole (PREVACID) 30 MG capsule Take 30 mg by mouth daily.    . metFORMIN (GLUCOPHAGE) 500 MG tablet Take 500 mg by mouth daily.    . metoprolol succinate (TOPROL-XL) 50 MG 24 hr tablet Take 1-2 tablets (50-100 mg total) by mouth See admin instructions. Take 100 mg by mouth in the morning and 50 mg at bedtime 180 tablet 3  . montelukast (SINGULAIR) 10 MG tablet Take 10 mg by mouth at bedtime.    . nitroGLYCERIN (NITROSTAT) 0.4 MG SL tablet Place 1 tablet under the tongue every 5 (five) minutes x 3 doses as needed for chest pain.     . potassium chloride (K-DUR) 10 MEQ tablet Take 10 mEq by mouth 2 (two) times a day.    Marland Kitchen. PRESCRIPTION MEDICATION See admin instructions. BiPAP: "Every evening and night shift"    . warfarin (COUMADIN) 5 MG tablet Take 1.5 tablets (7.5 mg total) by mouth daily. Take 7.5mg  for 2days then resume 5mg  daily for INR 2-3 (Patient taking differently: Take 7.5 mg by mouth See admin instructions. Take 7.5 mg by mouth once a day at 5 PM and HOLD from 03/21/2019 through 03/22/2019)    . furosemide (LASIX) 20 MG tablet TAKE LASIX 1 TABLET (20 MG) DAILY AND 2 TABLETS (40 MG) EVERY OTHER DAY. 90 tablet 3   No current facility-administered medications for this visit.      Allergies:   Codeine, Sulfa antibiotics, Sulfamethoxazole-trimethoprim, Metoclopramide, and Fenofibrate    Social History:  The patient  reports that she has never smoked. She has never used smokeless tobacco. She reports previous alcohol use. She reports that she does not use drugs.   Family History:  The patient's family history includes Diabetes Mellitus II in her mother and sister; Hypertension in her father, mother, and sister.    ROS:  General:no colds or  fevers, no weight changes Skin:no rashes or ulcers HEENT:no blurred vision, no congestion CV:see HPI PUL:see HPI GI:no diarrhea constipation or melena, no indigestion GU:no hematuria, no dysuria MS:no joint pain, no claudication Neuro:no syncope, no lightheadedness Endo:no diabetes, no thyroid disease Wt Readings from Last 3 Encounters:  04/07/19 170 lb 12.8 oz (77.5 kg)  04/03/19 168 lb (76.2 kg)  03/24/19 168 lb 11.2 oz (76.5 kg)     PHYSICAL EXAM: VS:  BP 120/64   Pulse 70   Ht 5' (1.524 m)   Wt 170 lb 12.8 oz (77.5 kg)   SpO2 95%   BMI 33.36 kg/m  , BMI Body mass index is 33.36 kg/m. General:Pleasant affect, NAD Skin:Warm and dry, brisk capillary refill HEENT:normocephalic, sclera clear, mucus membranes moist Neck:supple, no JVD, no bruits  Heart:S1S2 RRR without murmur, gallup, rub or click Lungs:clear without rales, rhonchi, or wheezes WUJ:WJXBAbd:soft, non tender, + BS, do not palpate liver spleen or masses Ext:no lower ext edema, 2+ pedal pulses, 2+ radial pulses Neuro:alert and oriented, MAE, follows commands, + facial symmetry    EKG:  EKG is not  ordered today. The ekg from the hospital with RBBB and a fib.  Prior she was in LBBB but discussed with EP and may be due to BiV.   Recent Labs: 03/22/2019: B Natriuretic Peptide 90.1 03/24/2019: BUN 23; Creatinine, Ser 0.98; Hemoglobin 13.4; Platelets 181; Potassium 3.8; Sodium 140    Lipid Panel No results found for: CHOL, TRIG, HDL, CHOLHDL, VLDL, LDLCALC,  LDLDIRECT     Other studies Reviewed: Additional studies/ records that were reviewed today include: . Echo 03/23/19 IMPRESSIONS    1. The left ventricle has low normal systolic function, with an ejection fraction of 50-55%. The cavity size was normal. Left ventricular diastolic function could not be evaluated secondary to atrial fibrillation.  2. The right ventricle has normal systolic function. The cavity was normal. There is no increase in right ventricular wall thickness.  3. Left atrial size was severely dilated.  4. Mitral valve regurgitation is mild to moderate by color flow Doppler.  5. The aortic valve is abnormal. Moderate thickening of the aortic valve. Moderate calcification of the aortic valve. Aortic valve regurgitation is mild by color flow Doppler. Mild-moderate stenosis of the aortic valve.  6. The interatrial septum was not assessed.  FINDINGS  Left Ventricle: The left ventricle has low normal systolic function, with an ejection fraction of 50-55%. The cavity size was normal. There is no increase in left ventricular wall thickness. Left ventricular diastolic function could not be evaluated  secondary to atrial fibrillation.  Right Ventricle: The right ventricle has normal systolic function. The cavity was normal. There is no increase in right ventricular wall thickness. Pacing wire/catheter visualized in the right ventricle.  Left Atrium: Left atrial size was severely dilated.  Right Atrium: Right atrial size was normal in size. Right atrial pressure is estimated at 8 mmHg.  Interatrial Septum: The interatrial septum was not assessed.  Pericardium: There is no evidence of pericardial effusion.  Mitral Valve: The mitral valve is normal in structure. Mitral valve regurgitation is mild to moderate by color flow Doppler.  Tricuspid Valve: The tricuspid valve is normal in structure. Tricuspid valve regurgitation is mild by color flow Doppler.  Aortic Valve:  The aortic valve is abnormal Moderate thickening of the aortic valve. Moderate calcification of the aortic valve. Aortic valve regurgitation is mild by color flow Doppler. There is Mild-moderate stenosis of the aortic valve, with a  calculated valve area of 0.83 cm.  Pulmonic Valve: The pulmonic valve was grossly normal. Pulmonic valve regurgitation is not visualized by color flow Doppler.  Nuc study 04/03/19  Nuclear stress EF: 45%.  No T wave inversion was noted during stress.  There was no ST segment deviation noted during stress.  Defect 1: There is a small defect of mild severity.  Findings consistent with prior myocardial infarction with peri-infarct ischemia.  This is an intermediate risk study.   Small size, moderate severity fixed basal to mid inferior perfusion defect, suspect attenuation artifact or pacer-related. LVEF 45% with mild inferior hypokinesis. This is an intermediate risk study.  ASSESSMENT AND PLAN:  1.  Chest pain with minimal CAD on cath 2015 neg troponin and now nuc study intermediate due to decreased EF but on echo it was normal.  If recurrent chest pain would proceed to cath. Follow up in 3 months with Dr. Marlou Porch.  2.  Carotid disease Needs carotid dopplers   3.  Chronic diastolic HF currently stable will decrease lasix as above.  Check BMP today.  4.  COPD and lung consolidation will send to pulmonary.  5.  HTN  Controlled today no change in meds  6.  DM-2 per PCP  7.  Permanent a fib s/p AV nod ablation with BiV PPM MDT.   8.  Coumadin for anticoagulation to have INR today.    9.  OSA with CPAP   Current medicines are reviewed with the patient today.  The patient Has no concerns regarding medicines.  The following changes have been made:  See above Labs/ tests ordered today include:see above  Disposition:   FU:  see above  Signed, Cecilie Kicks, NP  04/07/2019 9:32 PM    [facility] [place],  [place], [place], [place] Phone: [phone]; Fax: [phone]

## 2019-04-07 NOTE — Patient Instructions (Signed)
Description   Continue taking 1 tablet daily. Recheck INR in 1 week. Coumadin Clinic [phone] Main [phone]

## 2019-04-08 LAB — URIC ACID: Uric Acid: 9.1 mg/dL — ABNORMAL HIGH (ref 2.5–7.1)

## 2019-04-08 LAB — BASIC METABOLIC PANEL
BUN/Creatinine Ratio: 28 (ref 12–28)
BUN: 31 mg/dL — ABNORMAL HIGH (ref 8–27)
CO2: 24 mmol/L (ref 20–29)
Calcium: 9.1 mg/dL (ref 8.7–10.3)
Chloride: 102 mmol/L (ref 96–106)
Creatinine, Ser: 1.1 mg/dL — ABNORMAL HIGH (ref 0.57–1.00)
GFR calc Af Amer: 56 mL/min/{1.73_m2} — ABNORMAL LOW (ref >59)
GFR calc non Af Amer: 49 mL/min/{1.73_m2} — ABNORMAL LOW (ref >59)
Glucose: 101 mg/dL — ABNORMAL HIGH (ref 65–99)
Potassium: 3.6 mmol/L (ref 3.5–5.2)
Sodium: 144 mmol/L (ref 134–144)

## 2019-04-10 ENCOUNTER — Other Ambulatory Visit: Payer: Self-pay | Admitting: Cardiology

## 2019-04-10 MED ORDER — FUROSEMIDE 20 MG PO TABS: ORAL_TABLET | ORAL | 3 refills | Status: AC

## 2019-04-14 ENCOUNTER — Telehealth: Payer: Self-pay | Admitting: *Deleted

## 2019-04-14 NOTE — Telephone Encounter (Signed)
Pt has been made aware of her lab results and recommendations. See result note.

## 2019-04-15 ENCOUNTER — Other Ambulatory Visit: Payer: Self-pay

## 2019-04-15 ENCOUNTER — Ambulatory Visit (INDEPENDENT_AMBULATORY_CARE_PROVIDER_SITE_OTHER): Payer: Medicare Other | Admitting: *Deleted

## 2019-04-15 DIAGNOSIS — Z7901 Long term (current) use of anticoagulants: Secondary | ICD-10-CM | POA: Diagnosis not present

## 2019-04-15 DIAGNOSIS — Z5181 Encounter for therapeutic drug level monitoring: Secondary | ICD-10-CM | POA: Diagnosis not present

## 2019-04-15 DIAGNOSIS — I482 Chronic atrial fibrillation, unspecified: Secondary | ICD-10-CM

## 2019-04-15 LAB — POCT INR: INR: 2.8 (ref 2.0–3.0)

## 2019-04-15 NOTE — Patient Instructions (Addendum)
Description   Continue taking 1 tablet daily. Recheck INR in 2 weeks. Coumadin Clinic#[phone] & Main #[phone]      

## 2019-04-18 ENCOUNTER — Other Ambulatory Visit: Payer: Self-pay

## 2019-04-18 ENCOUNTER — Ambulatory Visit (INDEPENDENT_AMBULATORY_CARE_PROVIDER_SITE_OTHER): Payer: Medicare Other | Admitting: Podiatry

## 2019-04-18 ENCOUNTER — Encounter: Payer: Self-pay | Admitting: Podiatry

## 2019-04-18 VITALS — BP 125/63 | HR 76 | Temp 97.1°F

## 2019-04-18 DIAGNOSIS — B351 Tinea unguium: Secondary | ICD-10-CM | POA: Diagnosis not present

## 2019-04-18 DIAGNOSIS — E1151 Type 2 diabetes mellitus with diabetic peripheral angiopathy without gangrene: Secondary | ICD-10-CM | POA: Diagnosis not present

## 2019-04-18 DIAGNOSIS — M79675 Pain in left toe(s): Secondary | ICD-10-CM

## 2019-04-18 DIAGNOSIS — Z7901 Long term (current) use of anticoagulants: Secondary | ICD-10-CM

## 2019-04-18 DIAGNOSIS — M79674 Pain in right toe(s): Secondary | ICD-10-CM | POA: Diagnosis not present

## 2019-04-18 NOTE — Patient Instructions (Signed)
Diabetes Mellitus and Foot Care Foot care is an important part of your health, especially when you have diabetes. Diabetes may cause you to have problems because of poor blood flow (circulation) to your feet and legs, which can cause your skin to:  Become thinner and drier.  Break more easily.  Heal more slowly.  Peel and crack. You may also have nerve damage (neuropathy) in your legs and feet, causing decreased feeling in them. This means that you may not notice minor injuries to your feet that could lead to more serious problems. Noticing and addressing any potential problems early is the best way to prevent future foot problems. How to care for your feet Foot hygiene  Wash your feet daily with warm water and mild soap. Do not use hot water. Then, pat your feet and the areas between your toes until they are completely dry. Do not soak your feet as this can dry your skin.  Trim your toenails straight across. Do not dig under them or around the cuticle. File the edges of your nails with an emery board or nail file.  Apply a moisturizing lotion or petroleum jelly to the skin on your feet and to dry, brittle toenails. Use lotion that does not contain alcohol and is unscented. Do not apply lotion between your toes. Shoes and socks  Wear clean socks or stockings every day. Make sure they are not too tight. Do not wear knee-high stockings since they may decrease blood flow to your legs.  Wear shoes that fit properly and have enough cushioning. Always look in your shoes before you put them on to be sure there are no objects inside.  To break in new shoes, wear them for just a few hours a day. This prevents injuries on your feet. Wounds, scrapes, corns, and calluses  Check your feet daily for blisters, cuts, bruises, sores, and redness. If you cannot see the bottom of your feet, use a mirror or ask someone for help.  Do not cut corns or calluses or try to remove them with medicine.  If you  find a minor scrape, cut, or break in the skin on your feet, keep it and the skin around it clean and dry. You may clean these areas with mild soap and water. Do not clean the area with peroxide, alcohol, or iodine.  If you have a wound, scrape, corn, or callus on your foot, look at it several times a day to make sure it is healing and not infected. Check for: ? Redness, swelling, or pain. ? Fluid or blood. ? Warmth. ? Pus or a bad smell. General instructions  Do not cross your legs. This may decrease blood flow to your feet.  Do not use heating pads or hot water bottles on your feet. They may burn your skin. If you have lost feeling in your feet or legs, you may not know this is happening until it is too late.  Protect your feet from hot and cold by wearing shoes, such as at the beach or on hot pavement.  Schedule a complete foot exam at least once a year (annually) or more often if you have foot problems. If you have foot problems, report any cuts, sores, or bruises to your health care provider immediately. Contact a health care provider if:  You have a medical condition that increases your risk of infection and you have any cuts, sores, or bruises on your feet.  You have an injury that is not   healing.  You have redness on your legs or feet.  You feel burning or tingling in your legs or feet.  You have pain or cramps in your legs and feet.  Your legs or feet are numb.  Your feet always feel cold.  You have pain around a toenail. Get help right away if:  You have a wound, scrape, corn, or callus on your foot and: ? You have pain, swelling, or redness that gets worse. ? You have fluid or blood coming from the wound, scrape, corn, or callus. ? Your wound, scrape, corn, or callus feels warm to the touch. ? You have pus or a bad smell coming from the wound, scrape, corn, or callus. ? You have a fever. ? You have a red line going up your leg. Summary  Check your feet every day  for cuts, sores, red spots, swelling, and blisters.  Moisturize feet and legs daily.  Wear shoes that fit properly and have enough cushioning.  If you have foot problems, report any cuts, sores, or bruises to your health care provider immediately.  Schedule a complete foot exam at least once a year (annually) or more often if you have foot problems. This information is not intended to replace advice given to you by your health care provider. Make sure you discuss any questions you have with your health care provider. Document Released: 08/25/2000 Document Revised: 10/10/2017 Document Reviewed: 09/29/2016 Elsevier Patient Education  2020 [employer]   Onychomycosis/Fungal Toenails  WHAT IS IT? An infection that lies within the keratin of your nail plate that is caused by a fungus.  WHY ME? Fungal infections affect all ages, sexes, races, and creeds.  There may be many factors that predispose you to a fungal infection such as age, coexisting medical conditions such as diabetes, or an autoimmune disease; stress, medications, fatigue, genetics, etc.  Bottom line: fungus thrives in a warm, moist environment and your shoes offer such a location.  IS IT CONTAGIOUS? Theoretically, yes.  You do not want to share shoes, nail clippers or files with someone who has fungal toenails.  Walking around barefoot in the same room or sleeping in the same bed is unlikely to transfer the organism.  It is important to realize, however, that fungus can spread easily from one nail to the next on the same foot.  HOW DO WE TREAT THIS?  There are several ways to treat this condition.  Treatment may depend on many factors such as age, medications, pregnancy, liver and kidney conditions, etc.  It is best to ask your doctor which options are available to you.  1. No treatment.   Unlike many other medical concerns, you can live with this condition.  However for many people this can be a painful condition and may lead to  ingrown toenails or a bacterial infection.  It is recommended that you keep the nails cut short to help reduce the amount of fungal nail. 2. Topical treatment.  These range from herbal remedies to prescription strength nail lacquers.  About 40-50% effective, topicals require twice daily application for approximately 9 to 12 months or until an entirely new nail has grown out.  The most effective topicals are medical grade medications available through physicians offices. 3. Oral antifungal medications.  With an 80-90% cure rate, the most common oral medication requires 3 to 4 months of therapy and stays in your system for a year as the new nail grows out.  Oral antifungal medications do require   blood work to make sure it is a safe drug for you.  A liver function panel will be performed prior to starting the medication and after the first month of treatment.  It is important to have the blood work performed to avoid any harmful side effects.  In general, this medication safe but blood work is required. 4. Laser Therapy.  This treatment is performed by applying a specialized laser to the affected nail plate.  This therapy is noninvasive, fast, and non-painful.  It is not covered by insurance and is therefore, out of pocket.  The results have been very good with a 80-95% cure rate.  The [facility] is the only practice in the area to offer this therapy. 5. Permanent Nail Avulsion.  Removing the entire nail so that a new nail will not grow back. 

## 2019-04-22 ENCOUNTER — Ambulatory Visit (INDEPENDENT_AMBULATORY_CARE_PROVIDER_SITE_OTHER): Payer: Medicare Other | Admitting: Cardiology

## 2019-04-22 ENCOUNTER — Other Ambulatory Visit: Payer: Self-pay

## 2019-04-22 ENCOUNTER — Encounter: Payer: Self-pay | Admitting: Cardiology

## 2019-04-22 VITALS — BP 120/60 | HR 76 | Ht 60.0 in | Wt 171.0 lb

## 2019-04-22 DIAGNOSIS — I4821 Permanent atrial fibrillation: Secondary | ICD-10-CM

## 2019-04-22 NOTE — Progress Notes (Signed)
Electrophysiology Office Note   Date:  04/22/2019   ID:  Lauren Holland, DOB 1941-03-03, MRN [id number]  PCP:  Haywood Pao, MD  Cardiologist:  Marlou Porch Primary Electrophysiologist:  Hadia Minier Meredith Leeds, MD    No chief complaint on file.    History of Present Illness: Lauren Holland is a 78 y.o. female who is being seen today for the evaluation of pacemaker at the request of Daune Perch, NP. Presenting today for electrophysiology evaluation.   She has a history of nonischemic cardiomyopathy with recovered ejection fraction, diastolic heart failure, permanent atrial fibrillation status post AV node ablation and Medtronic CRT-P, aortic stenosis, COPD on oxygen, diabetes, CKD, TIA, PAD.  She underwent AV node ablation in 2013.  Fortunately her ejection fraction has improved back to normal.  Today, she denies symptoms of palpitations, chest pain, shortness of breath, orthopnea, PND, lower extremity edema, claudication, dizziness, presyncope, syncope, bleeding, or neurologic sequela. The patient is tolerating medications without difficulties.    Past Medical History:  Diagnosis Date  . Atrial fibrillation, chronic   . Chronic diastolic (congestive) heart failure ([place])   . CKD (chronic kidney disease), stage III ([place])   . COPD (chronic obstructive pulmonary disease) ([place])   . Depression   . GERD (gastroesophageal reflux disease)   . Gout   . HLD (hyperlipidemia)   . HTN (hypertension)   . LBBB (left bundle branch block)   . OSA on CPAP   . Pacemaker   . Type II diabetes mellitus with renal manifestations [facility])    Past Surgical History:  Procedure Laterality Date  . PACEMAKER PLACEMENT       Current Outpatient Medications  Medication Sig Dispense Refill  . acetaminophen (TYLENOL) 325 MG tablet Take 2 tablets (650 mg total) by mouth every 6 (six) hours as needed for mild pain (or Fever >/= 101).    Marland Kitchen amLODipine (NORVASC) 5 MG tablet Take 1 tablet (5 mg total) by mouth  daily. 30 tablet 0  . busPIRone (BUSPAR) 10 MG tablet Take 10 mg by mouth 2 (two) times a day.    . colchicine 0.6 MG tablet Take 1 tablet (0.6 mg total) by mouth daily. PLEASE FOLLOW UP WITH PRIMARY CARE DOCTOR FOR ANY FUTURE REFILLS. 30 tablet 0  . Fluticasone-Umeclidin-Vilant 100-62.5-25 MCG/INH AEPB Inhale 1 puff into the lungs daily.    . furosemide (LASIX) 20 MG tablet TAKE LASIX 1 TABLET (20 MG) DAILY AND 2 TABLETS (40 MG) EVERY OTHER DAY. 135 tablet 3  . gabapentin (NEURONTIN) 300 MG capsule Take 1 capsule (300 mg total) by mouth 3 (three) times daily. PLEASE FOLLOW UP WITH YOU PRIMARY CARE DOCTOR FOR ANY FUTURE REFILLS. 30 capsule 0  . Insulin Detemir (LEVEMIR FLEXTOUCH) 100 UNIT/ML Pen Inject 30 Units into the skin at bedtime.    . lansoprazole (PREVACID) 30 MG capsule Take 30 mg by mouth daily.    . metFORMIN (GLUCOPHAGE) 500 MG tablet Take 500 mg by mouth daily.    . metoprolol succinate (TOPROL-XL) 50 MG 24 hr tablet Take 1-2 tablets (50-100 mg total) by mouth See admin instructions. Take 100 mg by mouth in the morning and 50 mg at bedtime 180 tablet 3  . montelukast (SINGULAIR) 10 MG tablet Take 10 mg by mouth at bedtime.    . nitroGLYCERIN (NITROSTAT) 0.4 MG SL tablet Place 1 tablet under the tongue every 5 (five) minutes x 3 doses as needed for chest pain.     . potassium chloride (K-DUR) 10  MEQ tablet Take 10 mEq by mouth 2 (two) times a day.    Marland Kitchen. PRESCRIPTION MEDICATION See admin instructions. BiPAP: "Every evening and night shift"    . warfarin (COUMADIN) 5 MG tablet Take 1.5 tablets (7.5 mg total) by mouth daily. Take 7.5mg  for 2days then resume 5mg  daily for INR 2-3 (Patient taking differently: Take 7.5 mg by mouth See admin instructions. Take 7.5 mg by mouth once a day at 5 PM and HOLD from 03/21/2019 through 03/22/2019)     No current facility-administered medications for this visit.     Allergies:   Codeine, Sulfa antibiotics, Sulfamethoxazole-trimethoprim, Metoclopramide,  and Fenofibrate   Social History:  The patient  reports that she has never smoked. She has never used smokeless tobacco. She reports previous alcohol use. She reports that she does not use drugs.   Family History:  The patient's family history includes Diabetes Mellitus II in her mother and sister; Hypertension in her father, mother, and sister.    ROS:  Please see the history of present illness.   Otherwise, review of systems is positive for none.   All other systems are reviewed and negative.    PHYSICAL EXAM: VS:  BP 120/60   Pulse 76   Ht 5' (1.524 m)   Wt 171 lb (77.6 kg)   BMI 33.40 kg/m  , BMI Body mass index is 33.4 kg/m. GEN: Well nourished, well developed, in no acute distress  HEENT: normal  Neck: no JVD, carotid bruits, or masses Cardiac: RRR; no murmurs, rubs, or gallops,no edema  Respiratory:  clear to auscultation bilaterally, normal work of breathing GI: soft, nontender, nondistended, + BS MS: no deformity or atrophy  Skin: warm and dry, device pocket is well healed Neuro:  Strength and sensation are intact Psych: euthymic mood, full affect  EKG:  EKG is not ordered today. Personal review of the ekg ordered 03/22/2019 shows atrial fibrillation, ventricular paced  Device interrogation is reviewed today in detail.  See PaceArt for details.   Recent Labs: 03/22/2019: B Natriuretic Peptide 90.1 03/24/2019: Hemoglobin 13.4; Platelets 181 04/07/2019: BUN 31; Creatinine, Ser 1.10; Potassium 3.6; Sodium 144    Lipid Panel  No results found for: CHOL, TRIG, HDL, CHOLHDL, VLDL, LDLCALC, LDLDIRECT   Wt Readings from Last 3 Encounters:  04/22/19 171 lb (77.6 kg)  04/07/19 170 lb 12.8 oz (77.5 kg)  04/03/19 168 lb (76.2 kg)      Other studies Reviewed: Additional studies/ records that were reviewed today include: TTE 03/23/19  Review of the above records today demonstrates:   1. The left ventricle has low normal systolic function, with an ejection fraction of  50-55%. The cavity size was normal. Left ventricular diastolic function could not be evaluated secondary to atrial fibrillation.  2. The right ventricle has normal systolic function. The cavity was normal. There is no increase in right ventricular wall thickness.  3. Left atrial size was severely dilated.  4. Mitral valve regurgitation is mild to moderate by color flow Doppler.  5. The aortic valve is abnormal. Moderate thickening of the aortic valve. Moderate calcification of the aortic valve. Aortic valve regurgitation is mild by color flow Doppler. Mild-moderate stenosis of the aortic valve.  6. The interatrial septum was not assessed.  Myoview 04/03/19  Nuclear stress EF: 45%.  No T wave inversion was noted during stress.  There was no ST segment deviation noted during stress.  Defect 1: There is a small defect of mild severity.  Findings consistent  with prior myocardial infarction with peri-infarct ischemia.  This is an intermediate risk study.  ASSESSMENT AND PLAN:  1.  Permanent atrial fibrillation: Status post AV node ablation and Medtronic CRT-P.  Currently on warfarin.  We Lenea Bywater switch her remote interrogations to our system.    This patients CHA2DS2-VASc Score and unadjusted Ischemic Stroke Rate (% per year) is equal to 7.2 % stroke rate/year from a score of 5  Above score calculated as 1 point each if present [CHF, HTN, DM, Vascular=MI/PAD/Aortic Plaque, Age if 65-74, or Female] Above score calculated as 2 points each if present [Age > 75, or Stroke/TIA/TE]  2.  Nonischemic cardiomyopathy: Fortunately has recovery in ejection fraction.  Plan per primary cardiology.  3.  Hypertension: well controlled   Current medicines are reviewed at length with the patient today.   The patient does not have concerns regarding her medicines.  The following changes were made today:  none  Labs/ tests ordered today include:  No orders of the defined types were placed in this  encounter.    Disposition:   FU with Freddie Nghiem 1 year  Signed, Carrine Kroboth Jorja LoaMartin Gaile Allmon, MD  04/22/2019 1:03 PM     [facility] [address] [place] [address] [phone] (office) [phone] (fax)

## 2019-04-22 NOTE — Progress Notes (Signed)
Subjective: "My toenails are growing like crazy." Lauren RushingCora Holland presents today referred by Tisovec, Adelfa Kohichard W, MD for diabetic foot evaluation.  Patient relates 10 year history of diabetes.  Patient denies any history of foot wounds.  Patient denies any history of numbness, tingling, burning, pins/needles sensations.  Today, patient c/o of painful, discolored, thick toenails which interfere with daily activities.  Duration is several years.  She has [place] care in the [place] in [place].  [place] closer to her family.  Pain is aggravated when wearing enclosed shoe gear.   Past Medical History:  Diagnosis Date  . Atrial fibrillation, chronic   . Chronic diastolic (congestive) heart failure (HCC)   . CKD (chronic kidney disease), stage III (HCC)   . COPD (chronic obstructive pulmonary disease) (HCC)   . Depression   . GERD (gastroesophageal reflux disease)   . Gout   . HLD (hyperlipidemia)   . HTN (hypertension)   . LBBB (left bundle branch block)   . OSA on CPAP   . Pacemaker   . Type II diabetes mellitus with renal manifestations [facility](HCC)     Patient Active Problem List   Diagnosis Date Noted  . Long term (current) use of anticoagulants 03/31/2019  . Encounter for therapeutic drug monitoring 03/31/2019  . Heart palpitations 03/23/2019  . Chest pain 03/22/2019  . Fall 03/05/2019  . UTI (urinary tract infection) 03/05/2019  . Generalized weakness 03/05/2019  . Chronic diastolic (congestive) heart failure (HCC)   . Atrial fibrillation, chronic   . CKD (chronic kidney disease), stage III (HCC)   . COPD (chronic obstructive pulmonary disease) (HCC)   . Depression   . GERD (gastroesophageal reflux disease)   . Gout   . HLD (hyperlipidemia)   . HTN (hypertension)   . OSA on CPAP   . Type II diabetes mellitus with renal manifestations (HCC)   . Contusion of flank     Past Surgical History:  Procedure Laterality Date  . PACEMAKER  PLACEMENT       Current Outpatient Medications:  .  acetaminophen (TYLENOL) 325 MG tablet, Take 2 tablets (650 mg total) by mouth every 6 (six) hours as needed for mild pain (or Fever >/= 101)., Disp:  , Rfl:  .  amLODipine (NORVASC) 5 MG tablet, Take 1 tablet (5 mg total) by mouth daily., Disp: 30 tablet, Rfl: 0 .  busPIRone (BUSPAR) 10 MG tablet, Take 10 mg by mouth 2 (two) times a day., Disp: , Rfl:  .  colchicine 0.6 MG tablet, Take 1 tablet (0.6 mg total) by mouth daily. PLEASE FOLLOW UP WITH PRIMARY CARE DOCTOR FOR ANY FUTURE REFILLS., Disp: 30 tablet, Rfl: 0 .  Fluticasone-Umeclidin-Vilant 100-62.5-25 MCG/INH AEPB, Inhale 1 puff into the lungs daily., Disp: , Rfl:  .  furosemide (LASIX) 20 MG tablet, TAKE LASIX 1 TABLET (20 MG) DAILY AND 2 TABLETS (40 MG) EVERY OTHER DAY., Disp: 135 tablet, Rfl: 3 .  gabapentin (NEURONTIN) 300 MG capsule, Take 1 capsule (300 mg total) by mouth 3 (three) times daily. PLEASE FOLLOW UP WITH YOU PRIMARY CARE DOCTOR FOR ANY FUTURE REFILLS., Disp: 30 capsule, Rfl: 0 .  Insulin Detemir (LEVEMIR FLEXTOUCH) 100 UNIT/ML Pen, Inject 30 Units into the skin at bedtime., Disp: , Rfl:  .  lansoprazole (PREVACID) 30 MG capsule, Take 30 mg by mouth daily., Disp: , Rfl:  .  metFORMIN (GLUCOPHAGE) 500 MG tablet, Take 500 mg by mouth daily., Disp: , Rfl:  .  metoprolol succinate (TOPROL-XL) 50 MG 24 hr tablet, Take 1-2 tablets (50-100 mg total) by mouth See admin instructions. Take 100 mg by mouth in the morning and 50 mg at bedtime, Disp: 180 tablet, Rfl: 3 .  montelukast (SINGULAIR) 10 MG tablet, Take 10 mg by mouth at bedtime., Disp: , Rfl:  .  nitroGLYCERIN (NITROSTAT) 0.4 MG SL tablet, Place 1 tablet under the tongue every 5 (five) minutes x 3 doses as needed for chest pain. , Disp: , Rfl:  .  potassium chloride (K-DUR) 10 MEQ tablet, Take 10 mEq by mouth 2 (two) times a day., Disp: , Rfl:  .  PRESCRIPTION MEDICATION, See admin instructions. BiPAP: "Every evening and  night shift", Disp: , Rfl:  .  warfarin (COUMADIN) 5 MG tablet, Take 1.5 tablets (7.5 mg total) by mouth daily. Take 7.5mg  for 2days then resume 5mg  daily for INR 2-3 (Patient taking differently: Take 7.5 mg by mouth See admin instructions. Take 7.5 mg by mouth once a day at 5 PM and HOLD from 03/21/2019 through 03/22/2019), Disp: , Rfl:   Allergies  Allergen Reactions  . Codeine Palpitations and Other (See Comments)    "Allergic," per MAR  . Sulfa Antibiotics Anaphylaxis    "Allergic," per MAR  . Sulfamethoxazole-Trimethoprim Anaphylaxis    "Allergic," per MAR  . Metoclopramide Other (See Comments)    Tremors and "Allergic," per MAR   . Fenofibrate Other (See Comments)    "All[facility]," [facility]    Social History   Occupational History  . Not on file  Tobacco Use  . Smoking status: Never Smoker  . Smokeless tobacco: Never Used  Substance and Sexual Activity  . Alcohol use: Not Currently  . Drug use: Never  . Sexual activity: Not on file    Family History  Problem Relation Age of Onset  . Diabetes Mellitus II Mother   . Hypertension Mother   . Hypertension Father   . Diabetes Mellitus II Sister   . Hypertension Sister      There is no immunization history on file for this patient.  Review of systems: Positive Findings in bold print.  Constitutional:  chills, fatigue, fever, sweats, weight change Communication: Nurse, learning disabilitytranslator, sign Presenter, broadcastinglanguage translator, hand writing, iPad/Android device Head: headaches, head injury Eyes: changes in vision, eye pain, glaucoma, cataracts, macular degeneration, diplopia, glare,  light sensitivity, eyeglasses or contacts, blindness Ears nose mouth throat: hearing impaired, hearing aids,  ringing in ears, deaf, sign language,  vertigo, nosebleeds,  rhinitis,  cold sores, snoring, swollen glands Cardiovascular: HTN, edema, arrhythmia, pacemaker in place, defibrillator in place, chest pain/tightness, chronic anticoagulation, blood clot, heart  failure, MI Peripheral Vascular: leg cramps, varicose veins, blood clots, lymphedema, varicosities Respiratory:  difficulty breathing, denies congestion, SOB, wheezing, cough, emphysema Gastrointestinal: change in appetite or weight, abdominal pain, constipation, diarrhea, nausea, vomiting, vomiting blood, change in bowel habits, abdominal pain, jaundice, rectal bleeding, hemorrhoids, GERD Genitourinary:  nocturia,  pain on urination, polyuria,  blood in urine, Foley catheter, urinary urgency, ESRD on hemodialysis Musculoskeletal: amputation, cramping, stiff joints, painful joints, decreased joint motion, fractures, OA, gout, hemiplegia, paraplegia, uses cane, wheelchair bound, uses walker, uses rollator Skin: +changes in toenails, color change, dryness, itching, mole changes,  rash, wound(s) Neurological: headaches, numbness in feet, paresthesias in feet, burning in feet, fainting,  seizures, change in speech,  headaches, memory problems/poor historian, cerebral palsy, weakness, paralysis, CVA, TIA Endocrine: diabetes, hypothyroidism, hyperthyroidism,  goiter, dry mouth, flushing, heat intolerance,  cold intolerance,  excessive thirst, denies  polyuria,  nocturia Hematological:  easy bleeding, excessive bleeding, easy bruising, enlarged lymph nodes, on long term blood thinner, history of past transusions Allergy/immunological:  hives, eczema, frequent infections, multiple drug allergies, seasonal allergies, transplant recipient, multiple food allergies Psychiatric:  anxiety, depression, mood disorder, suicidal ideations, hallucinations, insomnia  Objective: Vitals:   04/18/19 1021  BP: 125/63  Pulse: 76  Temp: (!) 97.1 F (36.2 C)   Vascular Examination: Capillary refill time less than 3 seconds x 10 digits.  Dorsalis pedis pulses faintly palpable bilaterally.    Posterior tibial pulses diminished bilaterally.    Digital hair sparse x10 digits.  Skin temperature gradient WNL  b/l.  Dermatological Examination: Pedal skin is noted to be thin, shiny, and atrophic bilaterally.  There are no open wounds noted bilaterally.  Toenails 1-5 b/l discolored, thick, dystrophic with subungual debris and pain with palpation to nailbeds due to thickness of nails.  Musculoskeletal: Muscle strength 5/5 to all LE muscle groups.  Hammertoe deformity noted digits 2 through 5 bilaterally.  Hallux valgus deformity noted bilaterally.  Neurological: Sensation intact 5/5 bilaterally with 10 gram monofilament.  Vibratory sensation intact bilaterally.  Assessment: 1. Painful onychomycosis toenails 1-5 b/l  2. NIDDM with PAD 3. Patient on long-term blood thinner  Plan: 1. Discussed diabetic foot care principles. Literature dispensed on today. 2. Toenails 1-5 b/l were debrided in length and girth without iatrogenic bleeding. 3. We did discuss obtaining a baseline arterial brachial indices in the future due to her circulation.  She currently has no tissue loss no symptoms of claudication nor rest pain. 4. Patient to continue soft, supportive shoe gear. 5. Patient to report any pedal injuries to medical professional immediately. 6. Follow up 3 months.  7. Patient/POA to call should there be a concern in the interim.

## 2019-04-24 LAB — CUP PACEART INCLINIC DEVICE CHECK
Battery Remaining Longevity: 11 mo
Battery Voltage: 2.87 V
Brady Statistic AP VP Percent: 0 %
Brady Statistic AP VS Percent: 0 %
Brady Statistic AS VP Percent: 99.77 %
Brady Statistic AS VS Percent: 0.23 %
Brady Statistic RA Percent Paced: 0 %
Brady Statistic RV Percent Paced: 99.83 %
Date Time Interrogation Session: 20200811151546
Implantable Lead Implant Date: 20130701
Implantable Lead Implant Date: 20130701
Implantable Lead Implant Date: 20130701
Implantable Lead Location: 753858
Implantable Lead Location: 753859
Implantable Lead Location: 753860
Implantable Lead Model: 4296
Implantable Lead Model: 5076
Implantable Lead Model: 5076
Implantable Pulse Generator Implant Date: 20130701
Lead Channel Impedance Value: 1007 Ohm
Lead Channel Impedance Value: 1102 Ohm
Lead Channel Impedance Value: 1349 Ohm
Lead Channel Impedance Value: 361 Ohm
Lead Channel Impedance Value: 399 Ohm
Lead Channel Impedance Value: 475 Ohm
Lead Channel Impedance Value: 494 Ohm
Lead Channel Impedance Value: 494 Ohm
Lead Channel Impedance Value: 608 Ohm
Lead Channel Pacing Threshold Amplitude: 0.75 V
Lead Channel Pacing Threshold Amplitude: 2.25 V
Lead Channel Pacing Threshold Pulse Width: 0.4 ms
Lead Channel Pacing Threshold Pulse Width: 0.4 ms
Lead Channel Sensing Intrinsic Amplitude: 1.5 mV
Lead Channel Sensing Intrinsic Amplitude: 20 mV
Lead Channel Setting Pacing Amplitude: 2 V
Lead Channel Setting Pacing Amplitude: 2.75 V
Lead Channel Setting Pacing Pulse Width: 0.4 ms
Lead Channel Setting Pacing Pulse Width: 0.4 ms
Lead Channel Setting Sensing Sensitivity: 0.9 mV

## 2019-04-29 ENCOUNTER — Other Ambulatory Visit: Payer: Self-pay

## 2019-04-29 ENCOUNTER — Ambulatory Visit (INDEPENDENT_AMBULATORY_CARE_PROVIDER_SITE_OTHER): Payer: Medicare Other | Admitting: *Deleted

## 2019-04-29 DIAGNOSIS — I482 Chronic atrial fibrillation, unspecified: Secondary | ICD-10-CM

## 2019-04-29 DIAGNOSIS — Z7901 Long term (current) use of anticoagulants: Secondary | ICD-10-CM

## 2019-04-29 DIAGNOSIS — Z5181 Encounter for therapeutic drug level monitoring: Secondary | ICD-10-CM

## 2019-04-29 LAB — POCT INR: INR: 4.4 — AB (ref 2.0–3.0)

## 2019-04-29 NOTE — Patient Instructions (Signed)
Description   Do not take any Coumadin today and No Coumadin tomorrow then continue taking 1 tablet daily. Recheck INR in 2 weeks. [facility] [phone] Main [phone]

## 2019-05-05 ENCOUNTER — Institutional Professional Consult (permissible substitution): Payer: Medicare Other | Admitting: Pulmonary Disease

## 2019-05-05 ENCOUNTER — Telehealth: Payer: Self-pay | Admitting: *Deleted

## 2019-05-05 NOTE — Telephone Encounter (Signed)
Received voicemail from Lauren Holland calling stating that she was moving and needs to cancel her next appointment. Attempted to call [name] back.LMOM.

## 2019-05-05 NOTE — Progress Notes (Deleted)
Subjective:   PATIENT ID: Lauren Holland GENDER: female DOB: 1941/01/19, MRN: [id number]   HPI  No chief complaint on file.   Reason for Visit: New consult for abnormal CT  Ms. Lauren Holland is a 78 year old female SNF resident with COPD, OSA on CPAP, chronic diastolic heart failure, atrial fibrillation, hypertension, stage III CKD and type 2 diabetes who presents as a new consult for left lower lobe consolidation.  Records in EMR including discharge summaries from 6/27 and 7/13 reviewed and summarized: In June during a work-up of fall, an incidental left lower lobe abnormality noted on CT abdomen imaging.  In the interim, she was hospitalized for palpitations and atypical chest pain.CTA was obtained and previously noted lesion with interval increase density in size measuring 20 x 11 mm.   Social History:  Environmental exposures: ***  I have personally reviewed patient's past medical/family/social history, allergies, current medications.***  Past Medical History:  Diagnosis Date  . Atrial fibrillation, chronic   . Chronic diastolic (congestive) heart failure ([place])   . CKD (chronic kidney disease), stage III ([place])   . COPD (chronic obstructive pulmonary disease) ([place])   . Depression   . GERD (gastroesophageal reflux disease)   . Gout   . HLD (hyperlipidemia)   . HTN (hypertension)   . LBBB (left bundle branch block)   . OSA on CPAP   . Pacemaker   . Type II diabetes mellitus with renal manifestations (HCC)      Family History  Problem Relation Age of Onset  . Diabetes Mellitus II Mother   . Hypertension Mother   . Hypertension Father   . Diabetes Mellitus II Sister   . Hypertension Sister      Social History   Occupational History  . Not on file  Tobacco Use  . Smoking status: Never Smoker  . Smokeless tobacco: Never Used  Substance and Sexual Activity  . Alcohol use: Not Currently  . Drug use: Never  . Sexual activity: Not on file    Allergies  Allergen  Reactions  . Codeine Palpitations and Other (See Comments)    "Allergic," per MAR  . Sulfa Antibiotics Anaphylaxis    "Allergic," per MAR  . Sulfamethoxazole-Trimethoprim Anaphylaxis    "Allergic," per MAR  . Metoclopramide Other (See Comments)    Tremors and "Allergic," per MAR   . Fenofibrate Other (See Comments)    "Allergic," per [facility]     Outpatient Medications Prior to Visit  Medication Sig Dispense Refill  . acetaminophen (TYLENOL) 325 MG tablet Take 2 tablets (650 mg total) by mouth every 6 (six) hours as needed for mild pain (or Fever >/= 101).    Marland Kitchen amLODipine (NORVASC) 5 MG tablet Take 1 tablet (5 mg total) by mouth daily. 30 tablet 0  . busPIRone (BUSPAR) 10 MG tablet Take 10 mg by mouth 2 (two) times a day.    . colchicine 0.6 MG tablet Take 1 tablet (0.6 mg total) by mouth daily. PLEASE FOLLOW UP WITH PRIMARY CARE DOCTOR FOR ANY FUTURE REFILLS. 30 tablet 0  . Fluticasone-Umeclidin-Vilant 100-62.5-25 MCG/INH AEPB Inhale 1 puff into the lungs daily.    . furosemide (LASIX) 20 MG tablet TAKE LASIX 1 TABLET (20 MG) DAILY AND 2 TABLETS (40 MG) EVERY OTHER DAY. 135 tablet 3  . gabapentin (NEURONTIN) 300 MG capsule Take 1 capsule (300 mg total) by mouth 3 (three) times daily. PLEASE FOLLOW UP WITH YOU PRIMARY CARE DOCTOR FOR ANY FUTURE REFILLS. 30 capsule 0  .  Insulin Detemir (LEVEMIR FLEXTOUCH) 100 UNIT/ML Pen Inject 30 Units into the skin at bedtime.    . lansoprazole (PREVACID) 30 MG capsule Take 30 mg by mouth daily.    . metFORMIN (GLUCOPHAGE) 500 MG tablet Take 500 mg by mouth daily.    . metoprolol succinate (TOPROL-XL) 50 MG 24 hr tablet Take 1-2 tablets (50-100 mg total) by mouth See admin instructions. Take 100 mg by mouth in the morning and 50 mg at bedtime 180 tablet 3  . montelukast (SINGULAIR) 10 MG tablet Take 10 mg by mouth at bedtime.    . nitroGLYCERIN (NITROSTAT) 0.4 MG SL tablet Place 1 tablet under the tongue every 5 (five) minutes x 3 doses as needed for chest  pain.     . potassium chloride (K-DUR) 10 MEQ tablet Take 10 mEq by mouth 2 (two) times a day.    Marland Kitchen. PRESCRIPTION MEDICATION See admin instructions. BiPAP: "Every evening and night shift"    . warfarin (COUMADIN) 5 MG tablet Take 1.5 tablets (7.5 mg total) by mouth daily. Take 7.5mg  for 2days then resume 5mg  daily for INR 2-3 (Patient taking differently: Take 7.5 mg by mouth See admin instructions. Take 7.5 mg by mouth once a day at 5 PM and HOLD from 03/21/2019 through 03/22/2019)     No facility-administered medications prior to visit.     ROS   Objective:  There were no vitals filed for this visit.    Physical Exam: General: Well-appearing, no acute distress HENT: [place], AT, OP clear, MMM Eyes: EOMI, no scleral icterus Lymph: no cervical lymphadenopathy Respiratory: Clear to auscultation bilaterally.  No crackles, wheezing or rales Cardiovascular: RRR, -M/R/G, no JVD GI: BS+, soft, nontender Extremities:-Edema,-tenderness Neuro: AAO x4, CNII-XII grossly intact Skin: Intact, no rashes or bruising Psych: Normal mood, normal affect  Data Reviewed:  Imaging: CTA 03/22/2019-no evidence of PE.  20 x 11 mm consolidation in the left lower lobe with increased density compared to last month CT abdomen pelvis 03/05/2019-left lower lobe 1.8 cm airspace opacity  PFT: None on file  Labs: CBC    Component Value Date/Time   WBC 4.9 03/24/2019 0528   RBC 4.17 03/24/2019 0528   HGB 13.4 03/24/2019 0528   HCT 40.9 03/24/2019 0528   PLT 181 03/24/2019 0528   MCV 98.1 03/24/2019 0528   MCH 32.1 03/24/2019 0528   MCHC 32.8 03/24/2019 0528   RDW 15.1 03/24/2019 0528   BMET    Component Value Date/Time   NA 144 04/07/2019 1428   K 3.6 04/07/2019 1428   CL 102 04/07/2019 1428   CO2 24 04/07/2019 1428   GLUCOSE 101 (H) 04/07/2019 1428   GLUCOSE 128 (H) 03/24/2019 0528   BUN 31 (H) 04/07/2019 1428   CREATININE 1.10 (H) 04/07/2019 1428   CALCIUM 9.1 04/07/2019 1428   GFRNONAA 49 (L)  04/07/2019 1428   GFRAA 56 (L) 04/07/2019 1428   Imaging, labs and tests noted above have been reviewed independently by me.    Assessment & Plan:   Discussion: ***  Health Maintenance Pneumonia*** Influenza*** CT Lung Screen***  No orders of the defined types were placed in this encounter. No orders of the defined types were placed in this encounter.   No follow-ups on file.  Eyob Godlewski Mechele CollinJane Alaisa Moffitt, MD [facility] 05/05/2019 1:37 PM  Office Number [phone]

## 2019-05-06 ENCOUNTER — Telehealth: Payer: Self-pay | Admitting: Cardiology

## 2019-05-06 NOTE — Telephone Encounter (Signed)
New Message    1. Has your device fired? no  2. Is you device beeping? no  3. Are you experiencing draining or swelling at device site? no  4. Are you calling to see if we received your device transmission? no  5. Have you passed out? No  Patient has moved to [place] and wants to cancel her remote pacer checks    Please route to [place]

## 2019-05-06 NOTE — Telephone Encounter (Signed)
Left message again for pt  

## 2019-05-06 NOTE — Telephone Encounter (Signed)
LMOM

## 2019-05-07 NOTE — Telephone Encounter (Signed)
Spoke with pt.  Pt has moved back to [facility] and is going to start getting her INR's checked and coumadin dosed at [facility].

## 2019-05-09 NOTE — Telephone Encounter (Signed)
LMOVM requesting call back to the DC. Gave direct number for return call. 

## 2019-05-12 NOTE — Telephone Encounter (Signed)
Spoke w/ pt and she informed me that she will be seeing a new cardiologist on 05/16/2019. I informed her to tell them to request for her transfer and we will release her information to them. Pt verbalized understanding.

## 2019-05-21 NOTE — Telephone Encounter (Signed)
Patient has been released to another clinic.

## 2019-07-08 ENCOUNTER — Ambulatory Visit: Payer: Medicare Other | Admitting: Cardiology

## 2019-07-21 ENCOUNTER — Ambulatory Visit: Payer: Medicare Other | Admitting: Podiatry

## 2019-07-22 ENCOUNTER — Ambulatory Visit: Payer: Medicare Other | Admitting: Podiatry

## 2020-11-09 DEATH — deceased

## 2021-04-18 IMAGING — DX RIGHT ANKLE - COMPLETE 3+ VIEW
3 series · 3 of 3 positions shown · non-contrast
Comparison: None.

CLINICAL DATA: Fall

EXAM:
RIGHT ANKLE - COMPLETE 3+ VIEW

[ankle ap]
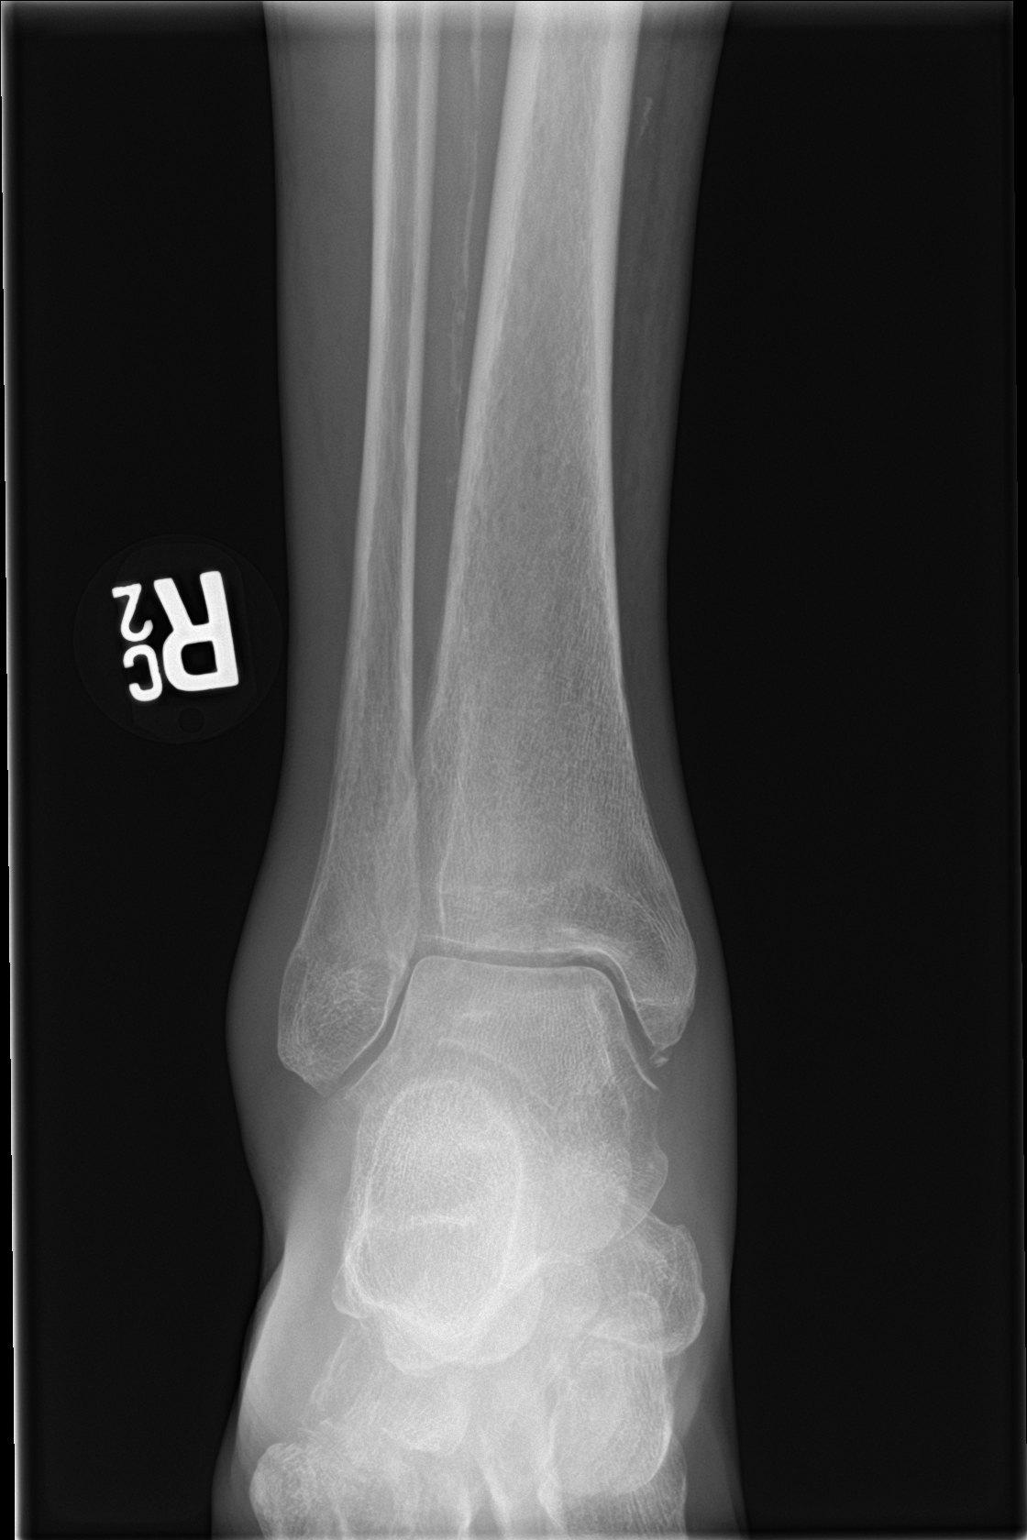

[ankle obl]
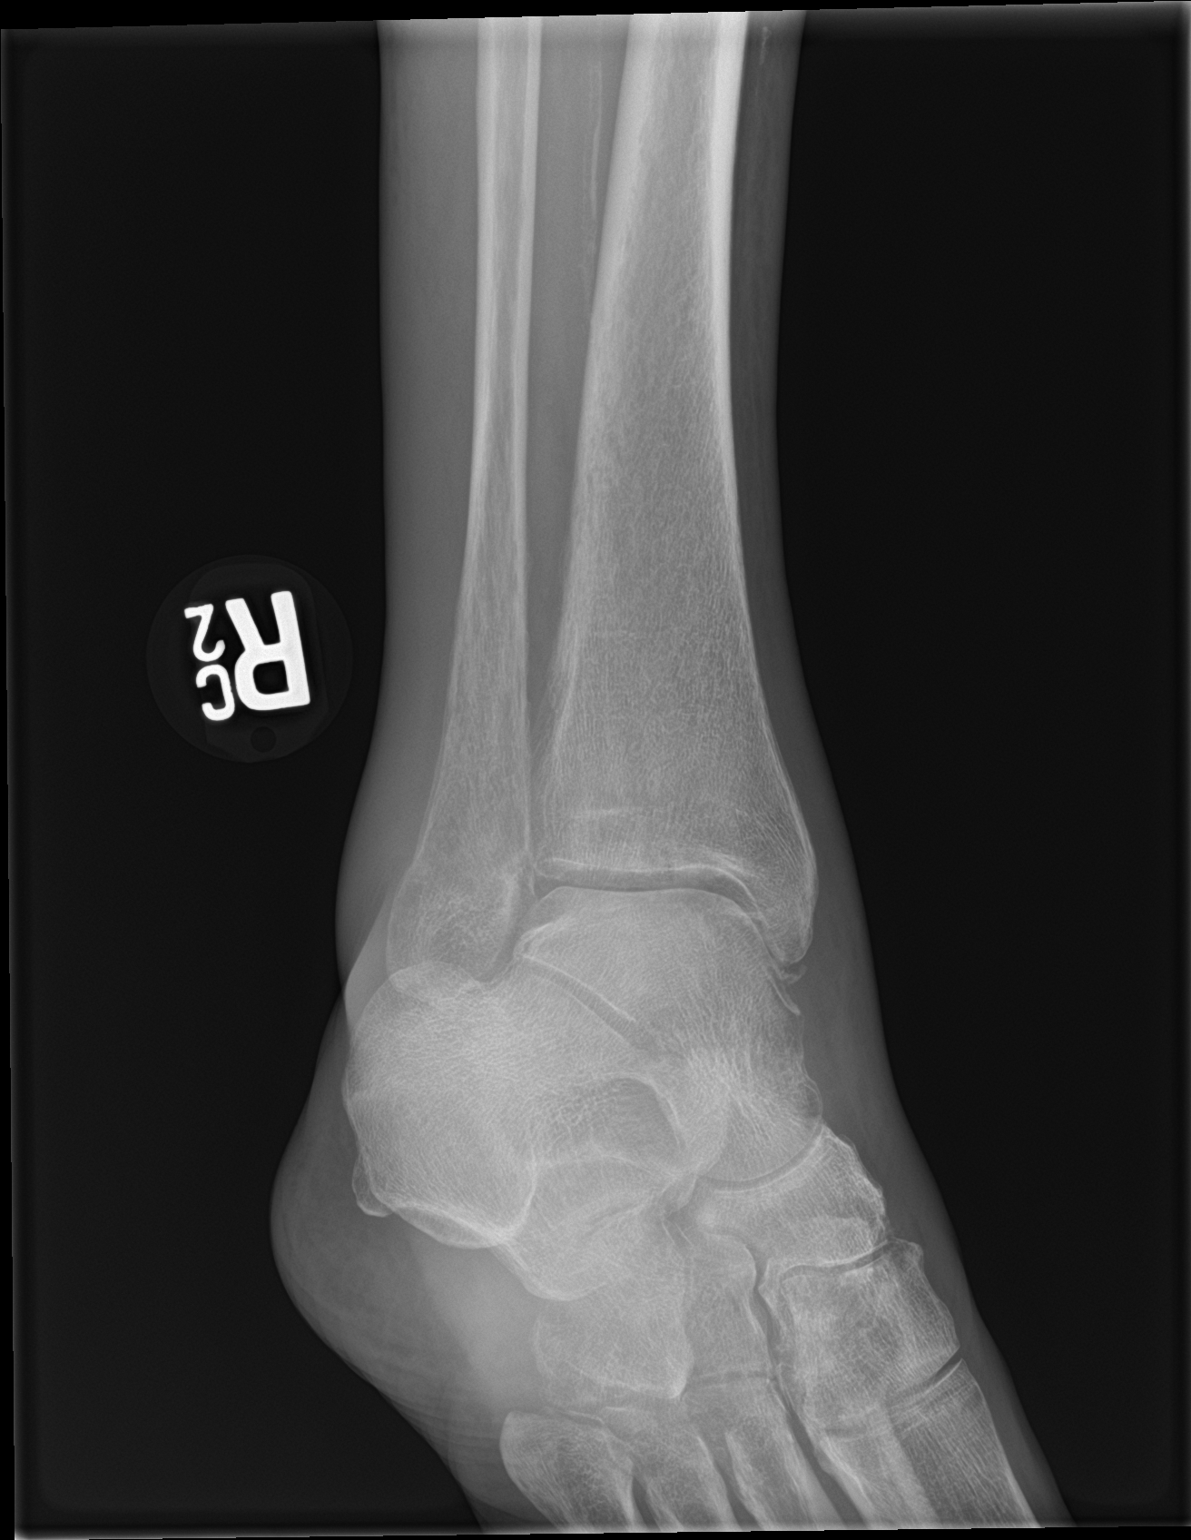

[ankle lat]
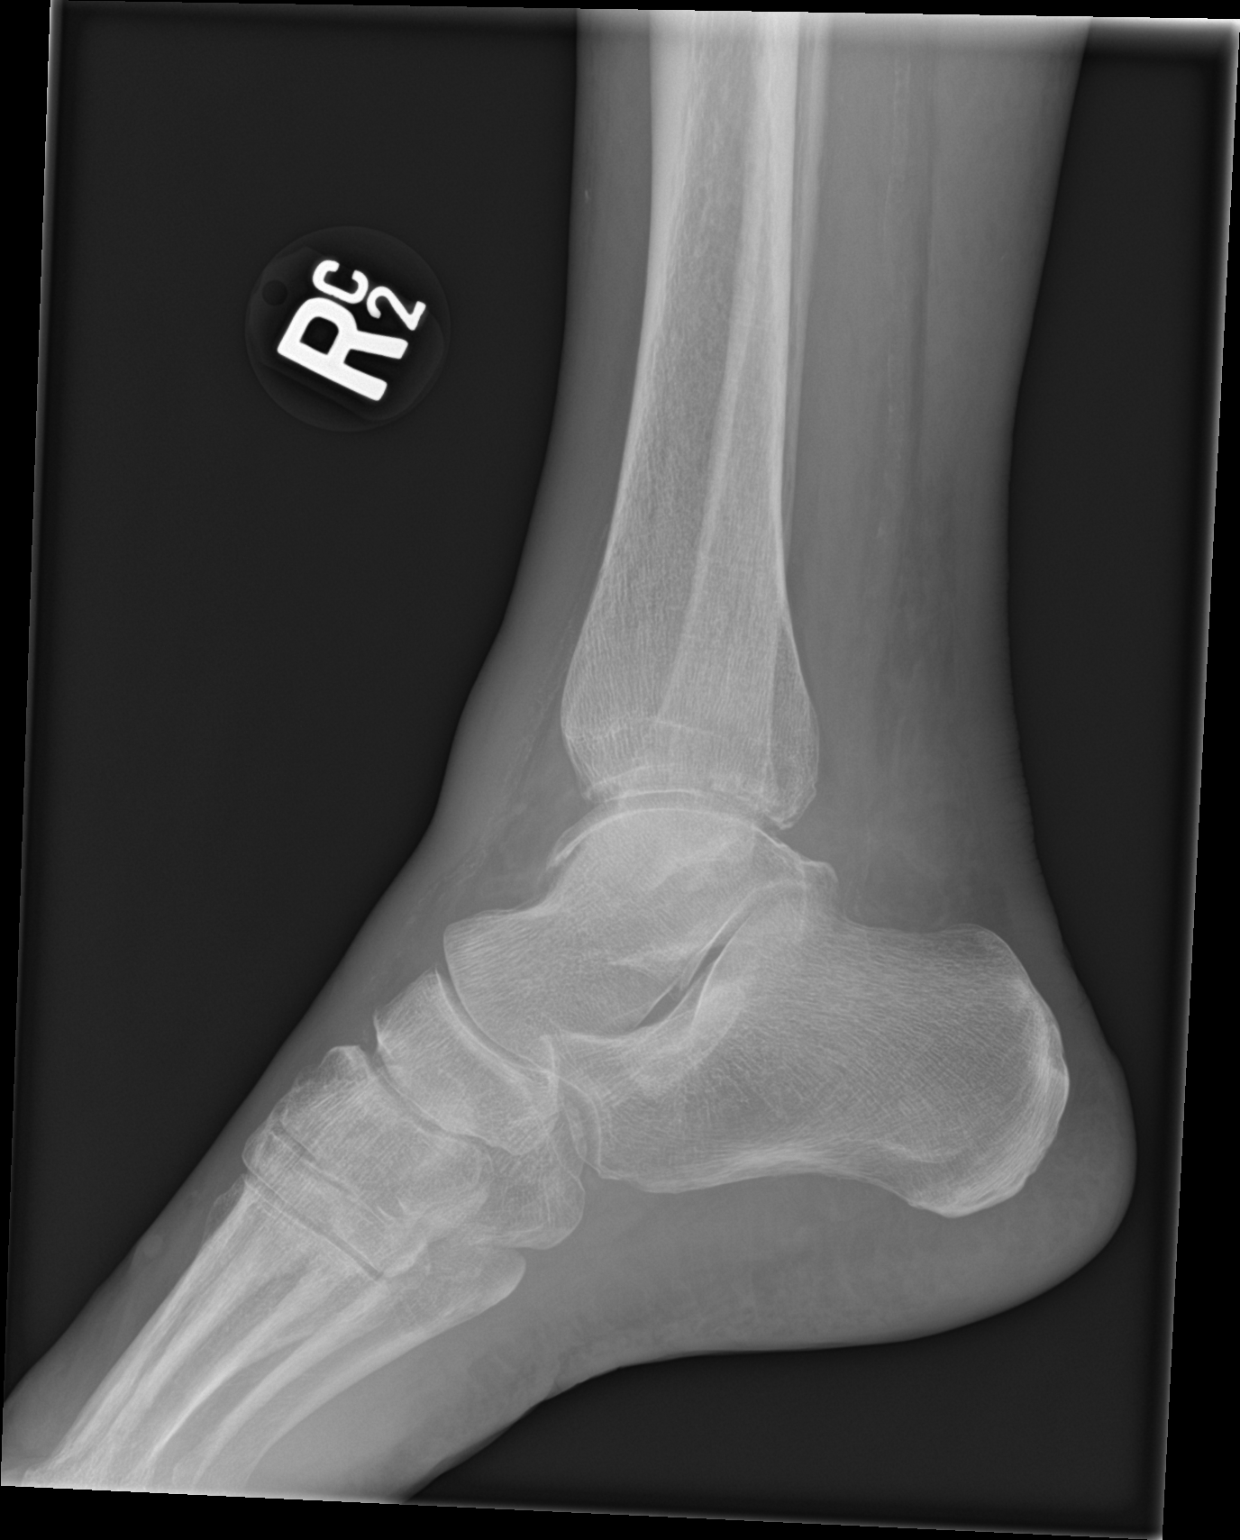

[3 of 3 positions shown; findings below may reference images not displayed]

FINDINGS: There is no evidence of fracture, dislocation, or joint effusion.
Ossific fragment at the medial malleolus is likely chronic. There is
no evidence of arthropathy or other focal bone abnormality. Moderate
lateral malleolus soft tissue swelling.
IMPRESSION: Moderate lateral soft tissue swelling without acute fracture or
dislocation.

Ossific fragment at the medial malleolus is age indeterminate, but
likely chronic. Correlation for point tenderness recommended, as a
small avulsion fracture may also have this appearance.

## 2021-05-07 IMAGING — CT CT ANGIOGRAPHY CHEST
1 of 7 series · 3 of 16 positions shown · IV contrast (omnipaque)
Comparison: Chest x-ray 03/22/2019

CLINICAL DATA: Palpitation with atypical chest pain

EXAM:
CT ANGIOGRAPHY CHEST WITH CONTRAST
TECHNIQUE: Multidetector CT imaging of the chest was performed using the
standard protocol during bolus administration of intravenous
contrast. Multiplanar CT image reconstructions and MIPs were
obtained to evaluate the vascular anatomy.
CONTRAST:  75mL OMNIPAQUE IOHEXOL 350 MG/ML SOLN

[Series 8: pe thins · axial · 0.93mm/px · z∈[+1166,+1306]mm · 3 of 402 slices shown]
[im 101/402  lung]
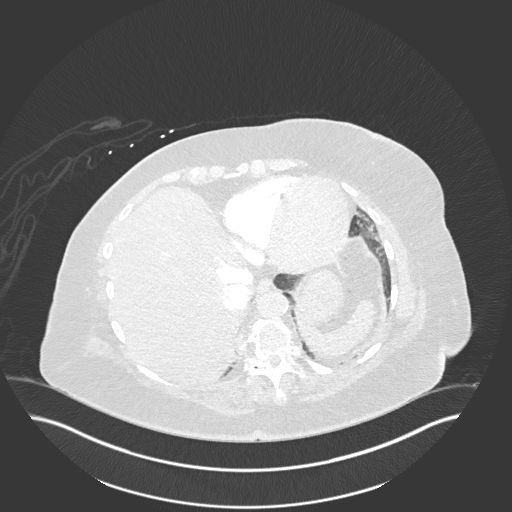
[im 201/402  soft-tissue]
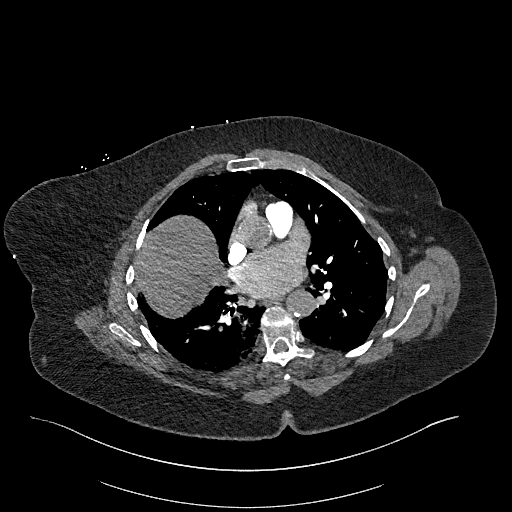
[im 301/402  lung]
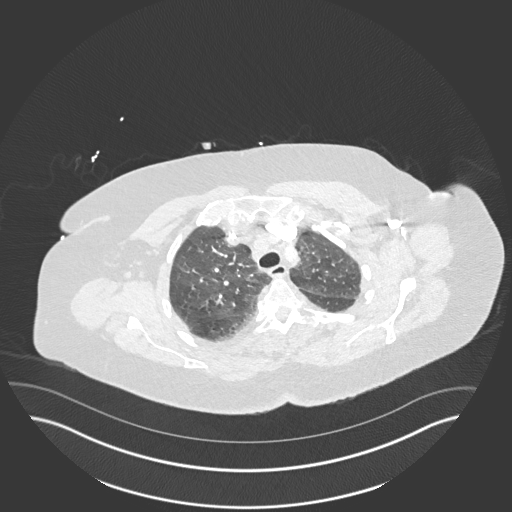

[3 of 16 positions shown; findings below may reference images not displayed]

FINDINGS: Cardiovascular: Satisfactory opacification of the pulmonary arteries
to the segmental level. Respiratory motion artifact limits
evaluation for emboli within the subsegmental branches of the lower
lobes. No filling defect within the central or main segmental
pulmonary arteries to suggest acute central embolus. Nonaneurysmal
aorta. Moderate aortic atherosclerosis. Coronary vascular
calcification. Normal heart size. No pericardial effusion. Partially
visualized intracardiac pacing leads. Reflux of contrast into the
IVC suggesting elevated right heart pressures.

Mediastinum/Nodes: Midline trachea. No thyroid mass. Subcentimeter
mediastinal lymph nodes. Esophagus within normal limits.

Lungs/Pleura: Mild mosaic pattern in the upper lobes, possible small
airways disease. Oval nodular area of consolidation within the left
lower lobe measuring 20 x 11 mm, appears slightly more solid in the
interim. Negative for pneumothorax or pleural effusion

Upper Abdomen: No acute abnormality.

Musculoskeletal: Degenerative changes. No acute or suspicious
abnormality.

Review of the MIP images confirms the above findings.
IMPRESSION: 1. Limited evaluation for emboli within the subsegmental vessels of
the lower lobes due to respiratory motion artifact. No acute central
embolus.
2. 20 x 11 mm ovoid consolidation in the left lower lobe, slightly
more solid in the interim. Consider one of the following in 3 months
for both low-risk and high-risk individuals: (a) repeat chest CT,
(b) follow-up PET-CT, or (c) tissue sampling. This recommendation
follows the consensus statement: Guidelines for Management of
Incidental Pulmonary Nodules Detected on CT Images: From the
3. Reflux of contrast into the hepatic veins suggesting elevated
right heart pressures.

Aortic Atherosclerosis ([id number]).
